# Patient Record
Sex: Male | Born: 1945 | Race: White | Hispanic: No | Marital: Married | Smoking: Former smoker
Health system: Southern US, Community
[De-identification: ages and names within clinical notes are randomized; demographics above are authoritative.]

## PROBLEM LIST (undated history)

## (undated) DIAGNOSIS — Z951 Presence of aortocoronary bypass graft: Secondary | ICD-10-CM

## (undated) DIAGNOSIS — I10 Essential (primary) hypertension: Secondary | ICD-10-CM

## (undated) DIAGNOSIS — I4891 Unspecified atrial fibrillation: Secondary | ICD-10-CM

## (undated) DIAGNOSIS — I2510 Atherosclerotic heart disease of native coronary artery without angina pectoris: Secondary | ICD-10-CM

## (undated) HISTORY — PX: CATARACT EXTRACTION W/ INTRAOCULAR LENS IMPLANT: SHX1309

## (undated) HISTORY — PX: HAND SURGERY: SHX662

## (undated) HISTORY — DX: Essential (primary) hypertension: I10

---
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
## 1997-11-26 HISTORY — PX: HERNIA REPAIR: SHX51

## 2016-01-28 DIAGNOSIS — J309 Allergic rhinitis, unspecified: Secondary | ICD-10-CM | POA: Diagnosis not present

## 2016-01-28 DIAGNOSIS — R05 Cough: Secondary | ICD-10-CM | POA: Diagnosis not present

## 2016-04-27 DIAGNOSIS — J069 Acute upper respiratory infection, unspecified: Secondary | ICD-10-CM | POA: Diagnosis not present

## 2016-05-02 DIAGNOSIS — J309 Allergic rhinitis, unspecified: Secondary | ICD-10-CM | POA: Diagnosis not present

## 2016-05-02 DIAGNOSIS — J0100 Acute maxillary sinusitis, unspecified: Secondary | ICD-10-CM | POA: Diagnosis not present

## 2016-05-21 DIAGNOSIS — R05 Cough: Secondary | ICD-10-CM | POA: Diagnosis not present

## 2016-05-21 DIAGNOSIS — R062 Wheezing: Secondary | ICD-10-CM | POA: Diagnosis not present

## 2016-05-21 DIAGNOSIS — R0602 Shortness of breath: Secondary | ICD-10-CM | POA: Diagnosis not present

## 2016-05-21 DIAGNOSIS — J309 Allergic rhinitis, unspecified: Secondary | ICD-10-CM | POA: Diagnosis not present

## 2016-06-04 DIAGNOSIS — I10 Essential (primary) hypertension: Secondary | ICD-10-CM | POA: Diagnosis not present

## 2016-06-04 DIAGNOSIS — J310 Chronic rhinitis: Secondary | ICD-10-CM | POA: Diagnosis not present

## 2016-06-04 DIAGNOSIS — R252 Cramp and spasm: Secondary | ICD-10-CM | POA: Diagnosis not present

## 2016-07-11 DIAGNOSIS — Z1389 Encounter for screening for other disorder: Secondary | ICD-10-CM | POA: Diagnosis not present

## 2016-07-11 DIAGNOSIS — I10 Essential (primary) hypertension: Secondary | ICD-10-CM | POA: Diagnosis not present

## 2016-07-11 DIAGNOSIS — E785 Hyperlipidemia, unspecified: Secondary | ICD-10-CM | POA: Diagnosis not present

## 2016-07-11 DIAGNOSIS — Z1212 Encounter for screening for malignant neoplasm of rectum: Secondary | ICD-10-CM | POA: Diagnosis not present

## 2016-07-11 DIAGNOSIS — Z6839 Body mass index (BMI) 39.0-39.9, adult: Secondary | ICD-10-CM | POA: Diagnosis not present

## 2016-07-11 DIAGNOSIS — R05 Cough: Secondary | ICD-10-CM | POA: Diagnosis not present

## 2016-07-11 DIAGNOSIS — R252 Cramp and spasm: Secondary | ICD-10-CM | POA: Diagnosis not present

## 2016-07-11 DIAGNOSIS — R0602 Shortness of breath: Secondary | ICD-10-CM | POA: Diagnosis not present

## 2016-07-11 DIAGNOSIS — Z0000 Encounter for general adult medical examination without abnormal findings: Secondary | ICD-10-CM | POA: Diagnosis not present

## 2016-07-17 DIAGNOSIS — T7840XA Allergy, unspecified, initial encounter: Secondary | ICD-10-CM | POA: Diagnosis not present

## 2016-07-20 DIAGNOSIS — R351 Nocturia: Secondary | ICD-10-CM | POA: Diagnosis not present

## 2016-07-20 DIAGNOSIS — I10 Essential (primary) hypertension: Secondary | ICD-10-CM | POA: Diagnosis not present

## 2016-07-24 DIAGNOSIS — Z1212 Encounter for screening for malignant neoplasm of rectum: Secondary | ICD-10-CM | POA: Diagnosis not present

## 2016-09-05 DIAGNOSIS — J329 Chronic sinusitis, unspecified: Secondary | ICD-10-CM | POA: Diagnosis not present

## 2016-09-05 DIAGNOSIS — J209 Acute bronchitis, unspecified: Secondary | ICD-10-CM | POA: Diagnosis not present

## 2016-09-10 DIAGNOSIS — J324 Chronic pansinusitis: Secondary | ICD-10-CM | POA: Diagnosis not present

## 2016-09-10 DIAGNOSIS — R05 Cough: Secondary | ICD-10-CM | POA: Diagnosis not present

## 2016-09-24 DIAGNOSIS — I10 Essential (primary) hypertension: Secondary | ICD-10-CM | POA: Diagnosis not present

## 2016-09-24 DIAGNOSIS — I517 Cardiomegaly: Secondary | ICD-10-CM | POA: Diagnosis not present

## 2016-09-24 DIAGNOSIS — R490 Dysphonia: Secondary | ICD-10-CM | POA: Diagnosis not present

## 2016-10-04 DIAGNOSIS — R490 Dysphonia: Secondary | ICD-10-CM | POA: Diagnosis not present

## 2016-10-29 DIAGNOSIS — Z9109 Other allergy status, other than to drugs and biological substances: Secondary | ICD-10-CM | POA: Diagnosis not present

## 2016-10-29 DIAGNOSIS — K219 Gastro-esophageal reflux disease without esophagitis: Secondary | ICD-10-CM | POA: Diagnosis not present

## 2017-09-17 DIAGNOSIS — J309 Allergic rhinitis, unspecified: Secondary | ICD-10-CM | POA: Diagnosis not present

## 2017-09-20 DIAGNOSIS — H25043 Posterior subcapsular polar age-related cataract, bilateral: Secondary | ICD-10-CM | POA: Diagnosis not present

## 2017-09-20 DIAGNOSIS — H25813 Combined forms of age-related cataract, bilateral: Secondary | ICD-10-CM | POA: Diagnosis not present

## 2017-10-07 DIAGNOSIS — H25811 Combined forms of age-related cataract, right eye: Secondary | ICD-10-CM | POA: Diagnosis not present

## 2017-10-07 DIAGNOSIS — H2511 Age-related nuclear cataract, right eye: Secondary | ICD-10-CM | POA: Diagnosis not present

## 2017-11-25 DIAGNOSIS — I10 Essential (primary) hypertension: Secondary | ICD-10-CM | POA: Diagnosis not present

## 2017-11-25 DIAGNOSIS — J329 Chronic sinusitis, unspecified: Secondary | ICD-10-CM | POA: Diagnosis not present

## 2017-11-30 DIAGNOSIS — J309 Allergic rhinitis, unspecified: Secondary | ICD-10-CM | POA: Diagnosis not present

## 2017-12-26 DIAGNOSIS — J320 Chronic maxillary sinusitis: Secondary | ICD-10-CM | POA: Diagnosis not present

## 2017-12-26 DIAGNOSIS — Z9181 History of falling: Secondary | ICD-10-CM | POA: Diagnosis not present

## 2017-12-26 DIAGNOSIS — Z6841 Body Mass Index (BMI) 40.0 and over, adult: Secondary | ICD-10-CM | POA: Diagnosis not present

## 2017-12-26 DIAGNOSIS — Z1331 Encounter for screening for depression: Secondary | ICD-10-CM | POA: Diagnosis not present

## 2018-01-31 DIAGNOSIS — J320 Chronic maxillary sinusitis: Secondary | ICD-10-CM | POA: Diagnosis not present

## 2018-01-31 DIAGNOSIS — R05 Cough: Secondary | ICD-10-CM | POA: Diagnosis not present

## 2018-01-31 DIAGNOSIS — E538 Deficiency of other specified B group vitamins: Secondary | ICD-10-CM | POA: Diagnosis not present

## 2018-05-23 DIAGNOSIS — H7291 Unspecified perforation of tympanic membrane, right ear: Secondary | ICD-10-CM | POA: Diagnosis not present

## 2018-05-23 DIAGNOSIS — H6691 Otitis media, unspecified, right ear: Secondary | ICD-10-CM | POA: Diagnosis not present

## 2018-05-23 DIAGNOSIS — Z6841 Body Mass Index (BMI) 40.0 and over, adult: Secondary | ICD-10-CM | POA: Diagnosis not present

## 2018-06-06 DIAGNOSIS — R0982 Postnasal drip: Secondary | ICD-10-CM | POA: Diagnosis not present

## 2018-06-06 DIAGNOSIS — Z6841 Body Mass Index (BMI) 40.0 and over, adult: Secondary | ICD-10-CM | POA: Diagnosis not present

## 2018-06-06 DIAGNOSIS — H7291 Unspecified perforation of tympanic membrane, right ear: Secondary | ICD-10-CM | POA: Diagnosis not present

## 2018-06-06 DIAGNOSIS — H6691 Otitis media, unspecified, right ear: Secondary | ICD-10-CM | POA: Diagnosis not present

## 2018-07-18 DIAGNOSIS — Z6841 Body Mass Index (BMI) 40.0 and over, adult: Secondary | ICD-10-CM | POA: Diagnosis not present

## 2018-07-18 DIAGNOSIS — R0982 Postnasal drip: Secondary | ICD-10-CM | POA: Diagnosis not present

## 2018-07-18 DIAGNOSIS — Z0000 Encounter for general adult medical examination without abnormal findings: Secondary | ICD-10-CM | POA: Diagnosis not present

## 2018-07-18 DIAGNOSIS — R05 Cough: Secondary | ICD-10-CM | POA: Diagnosis not present

## 2018-07-18 DIAGNOSIS — Z1211 Encounter for screening for malignant neoplasm of colon: Secondary | ICD-10-CM | POA: Diagnosis not present

## 2018-09-01 ENCOUNTER — Ambulatory Visit (INDEPENDENT_AMBULATORY_CARE_PROVIDER_SITE_OTHER): Payer: PPO | Admitting: Allergy and Immunology

## 2018-09-01 ENCOUNTER — Encounter: Payer: Self-pay | Admitting: Allergy and Immunology

## 2018-09-01 VITALS — BP 140/92 | HR 64 | Resp 20 | Ht 67.0 in | Wt 270.0 lb

## 2018-09-01 DIAGNOSIS — K219 Gastro-esophageal reflux disease without esophagitis: Secondary | ICD-10-CM | POA: Diagnosis not present

## 2018-09-01 DIAGNOSIS — J3089 Other allergic rhinitis: Secondary | ICD-10-CM | POA: Diagnosis not present

## 2018-09-01 MED ORDER — FAMOTIDINE 40 MG PO TABS: 40.0000 mg | ORAL_TABLET | Freq: Every evening | ORAL | 5 refills | Status: DC

## 2018-09-01 MED ORDER — MONTELUKAST SODIUM 10 MG PO TABS: 10.0000 mg | ORAL_TABLET | Freq: Every day | ORAL | 5 refills | Status: DC

## 2018-09-01 MED ORDER — ESOMEPRAZOLE MAGNESIUM 40 MG PO CPDR: 40.0000 mg | DELAYED_RELEASE_CAPSULE | Freq: Two times a day (BID) | ORAL | 5 refills | Status: DC

## 2018-09-01 NOTE — Patient Instructions (Addendum)
  1.  Treat and prevent inflammation:   A.  OTC Nasacort 1 spray each nostril 1 time per day  B.  Montelukast 10 mg tablet 1 time per day  2.  Treat and prevent reflux:   A.   Increase Nexium 40 mg two times per day  B.  Start famotidine 40 mg and evening  C.  Consolidate caffeine consumption  3.  If needed:   A.  Cetirizine 10 mg tablet 1 time per day  B. Azelastine -1-2 sprays each nostril twice a day  4.  Return to clinic in 3 weeks or earlier if problem

## 2018-09-01 NOTE — Progress Notes (Signed)
Dear Dr. Nicki Reaper,  Thank you for referring Tony Holloway to the [facility] on 09/01/2018.   Below is a summation of this patient's evaluation and recommendations.  Thank you for your referral. I will keep you informed about this patient's response to treatment.   If you have any questions please do not hesitate to contact me.   Sincerely,  Jiles Prows, MD Allergy / Immunology [place]   ______________________________________________________________________    NEW PATIENT NOTE  Referring Provider: Myer Peer, MD Primary Provider: Myer Peer, MD Date of office visit: 09/01/2018    Subjective:   Chief Complaint:  Tony Holloway (DOB: [birth year]) is a 72 y.o. male who presents to the clinic on 09/01/2018 with a chief complaint of Cough .     HPI: Erin presents to this clinic in evaluation of cough.  He has a 5-year history of developing unrelenting cough occasionally associated with posttussive syncope.  He states that he develops an issue where his nose start pouring with lots of blowing and then it drips down into his throat and sets off his coughing spell.  As well, he has a daily sensation of having postnasal drip in his throat and a knot in his throat and constant throat clearing.  This has been somewhat worse over the course of the past 2 months or so.  There is not a history of posttussive emesis but he does have a lot of gagging.  There is not a history of chest tightness or shortness of breath or wheezing.  There is not a history of sneezing or inability to smell or taste.  Apparently he has had a chest x-ray in 2017 which has been normal and he has had ENT perform rhinoscopy in 2018 which has been normal.  He does have reflux disease for which he is using Nexium 1 time per day.  While utilizing this form of treatment he still has occasional regurgitation high in his  chest and throat at nighttime.  He consumes 2 cups of coffee per day and just recently discontinued his 3 diet Cokes per day.  He does not consume tea or chocolate or alcohol.    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  Past Medical History:  Diagnosis Date  . Hypertension     Past Surgical History:  Procedure Laterality Date  . CATARACT EXTRACTION W/ INTRAOCULAR LENS IMPLANT    . HAND SURGERY    . HERNIA REPAIR  1999    Allergies as of 09/01/2018      Reactions   Codeine Other (See Comments)   Shaking, dizzy, lightheaded      Medication List      Azelastine HCl 0.15 % Soln Place 2 sprays into both nostrils 2 (two) times daily.   cefdinir 300 MG capsule Commonly known as:  OMNICEF Take 1 capsule by mouth daily.   esomeprazole 40 MG capsule Commonly known as:  NEXIUM Take 1 capsule by mouth daily.   glucosamine-chondroitin 500-400 MG tablet Take 1 tablet by mouth 3 (three) times daily.   metoprolol tartrate 25 MG tablet Commonly known as:  LOPRESSOR Take 1 tablet by mouth daily.       Review of systems negative except as noted in HPI / PMHx or noted below:  Review of Systems  Constitutional: Negative.   HENT: Negative.  Eyes: Negative.   Respiratory: Negative.   Cardiovascular: Negative.   Gastrointestinal: Negative.   Genitourinary: Negative.   Musculoskeletal: Negative.   Skin: Negative.   Neurological: Negative.   Endo/Heme/Allergies: Negative.   Psychiatric/Behavioral: Negative.     Family History  Problem Relation Age of Onset  . Lung cancer Father     Social History   Socioeconomic History  . Marital status: Married    Spouse name: Not on file  . Number of children: Not on file  . Years of education: Not on file  . Highest education level: Not on file  Occupational History  . Not on file  Social Needs  . Financial resource strain: Not on file  . Food insecurity:    Worry: Not on file     Inability: Not on file  . Transportation needs:    Medical: Not on file    Non-medical: Not on file  Tobacco Use  . Smoking status: Former Smoker    Types: Cigarettes    Last attempt to quit: 09/01/1978    Years since quitting: 40.0  . Smokeless tobacco: Never Used  Substance and Sexual Activity  . Alcohol use: Never    Frequency: Never  . Drug use: Never  . Sexual activity: Not on file  Lifestyle  . Physical activity:    Days per week: Not on file    Minutes per session: Not on file  . Stress: Not on file  Relationships  . Social connections:    Talks on phone: Not on file    Gets together: Not on file    Attends religious service: Not on file    Active member of club or organization: Not on file    Attends meetings of clubs or organizations: Not on file    Relationship status: Not on file  . Intimate partner violence:    Fear of current or ex partner: Not on file    Emotionally abused: Not on file    Physically abused: Not on file    Forced sexual activity: Not on file  Other Topics Concern  . Not on file  Social History Narrative  . Not on file    Environmental and Social history  Lives in a house with a dry environment, 2 dogs located in the house, carpet in the bedroom, no plastic on the bed or pillow, no smokers located in the house.   Objective:   Vitals:   09/01/18 1411  BP: (!) 140/92  Pulse: 64  Resp: 20  SpO2: 95%   Height: 5\' 7"  (170.2 cm) Weight: 270 lb (122.5 kg)  Physical Exam  HENT:  Head: Normocephalic. Head is without right periorbital erythema and without left periorbital erythema.  Right Ear: Tympanic membrane, external ear and ear canal normal.  Left Ear: Tympanic membrane, external ear and ear canal normal.  Nose: Nose normal. No mucosal edema or rhinorrhea.  Mouth/Throat: Oropharynx is clear and moist and mucous membranes are normal. No oropharyngeal exudate.  Eyes: Pupils are equal, round, and reactive to light. Conjunctivae and  lids are normal.  Neck: Trachea normal. No tracheal deviation present. No thyromegaly present.  Cardiovascular: Normal rate, regular rhythm, S1 normal, S2 normal and normal heart sounds.  No murmur heard. Pulmonary/Chest: Effort normal. No stridor. No respiratory distress. He has no wheezes. He has no rales. He exhibits no tenderness.  Abdominal: Soft. He exhibits no distension and no mass. There is no hepatosplenomegaly. There is no tenderness. There is no rebound  and no guarding.  Musculoskeletal: He exhibits no edema or tenderness.  Lymphadenopathy:       Head (right side): No tonsillar adenopathy present.       Head (left side): No tonsillar adenopathy present.    He has no cervical adenopathy.    He has no axillary adenopathy.  Neurological: He is alert.  Skin: No rash noted. He is not diaphoretic. No erythema. No pallor. Nails show no clubbing.    Diagnostics: Allergy skin tests were not performed secondary to the recent use of an antihistamine.   Spirometry was performed and demonstrated an FEV1 of 2.59 @ 90 % of predicted. FEV1/FVC = 0.84  Review of the chest x-ray obtained 11 July 2016 identified bilateral cardiac enlargement without any significant lung abnormality.  Assessment and Plan:    1. Other allergic rhinitis   2. LPRD (laryngopharyngeal reflux disease)     1.  Treat and prevent inflammation:   A.  OTC Nasacort 1 spray each nostril 1 time per day  B.  Montelukast 10 mg tablet 1 time per day  2.  Treat and prevent reflux:   A.   Increase Nexium 40 mg two times per day  B.  Start famotidine 40 mg and evening  C.  Consolidate caffeine consumption  3.  If needed:   A.  Cetirizine 10 mg tablet 1 time per day  B. Azelastine -1-2 sprays each nostril twice a day  4.  Return to clinic in 3 weeks or earlier if problem  Jahbari appears to have significant inflammation and irritation of his airway and we will treat him with anti-inflammatory agents for his upper  airway and treat him more aggressively for his reflux to address the irritation of his mid airway.  I will regroup with him in 3 weeks to assess his response to this approach.  Further evaluation and treatment will be based upon his response.  Jiles Prows, MD Allergy / Immunology [facility]

## 2018-09-02 ENCOUNTER — Encounter: Payer: Self-pay | Admitting: Allergy and Immunology

## 2018-09-02 ENCOUNTER — Telehealth: Payer: Self-pay | Admitting: Allergy and Immunology

## 2018-09-02 NOTE — Telephone Encounter (Signed)
Tony Holloway's wife called and states that Tony Holloway takes Zyrtec at bedtime and Dr. Neldon Mc wants him to take Singulair at bedtime as well.  She would like to know if it is safe to take those two together at bed time?  Please advise.

## 2018-09-02 NOTE — Telephone Encounter (Signed)
Patient wife advised of okay to take medications together

## 2018-09-03 ENCOUNTER — Encounter: Payer: Self-pay | Admitting: Allergy and Immunology

## 2018-09-16 ENCOUNTER — Ambulatory Visit: Payer: Self-pay

## 2018-09-16 ENCOUNTER — Encounter: Payer: Self-pay | Admitting: Podiatry

## 2018-09-16 ENCOUNTER — Ambulatory Visit: Payer: PPO | Admitting: Podiatry

## 2018-09-16 VITALS — BP 156/83 | HR 66 | Resp 16 | Ht 71.0 in | Wt 263.0 lb

## 2018-09-16 DIAGNOSIS — M2011 Hallux valgus (acquired), right foot: Secondary | ICD-10-CM | POA: Diagnosis not present

## 2018-09-16 DIAGNOSIS — Z23 Encounter for immunization: Secondary | ICD-10-CM | POA: Diagnosis not present

## 2018-09-16 DIAGNOSIS — M21611 Bunion of right foot: Secondary | ICD-10-CM | POA: Diagnosis not present

## 2018-09-16 DIAGNOSIS — M21961 Unspecified acquired deformity of right lower leg: Secondary | ICD-10-CM | POA: Diagnosis not present

## 2018-09-16 DIAGNOSIS — G5781 Other specified mononeuropathies of right lower limb: Secondary | ICD-10-CM

## 2018-09-16 DIAGNOSIS — M205X1 Other deformities of toe(s) (acquired), right foot: Secondary | ICD-10-CM

## 2018-09-16 DIAGNOSIS — M25571 Pain in right ankle and joints of right foot: Secondary | ICD-10-CM | POA: Diagnosis not present

## 2018-09-16 NOTE — Progress Notes (Signed)
   Subjective:    Patient ID: Tony Holloway, male    DOB: January 11, 1946, 72 y.o.   MRN: [id number]  HPI    Review of Systems  Musculoskeletal: Positive for myalgias.  All other systems reviewed and are negative.      Objective:   Physical Exam        Assessment & Plan:

## 2018-09-16 NOTE — Progress Notes (Signed)
  Subjective:  Patient ID: Tony Holloway, male    DOB: Apr 16, 1946,  MRN: [id number]  Chief Complaint  Patient presents with  . Pain    R sub met 2 x 2 mo; 2/10 dull ache -no injuries Tx: none Pt. stated," it only hurts with certain shoes on and it feels like stepping on a marshmellow."    72 y.o. male presents with the above complaint. Above history confirmed with patient. Also complains or sometimes painful right foot bunion, had for many years, with some deep pain within the joint.  Review of Systems: Negative except as noted in the HPI. Denies N/V/F/Ch.  Past Medical History:  Diagnosis Date  . Hypertension     Current Outpatient Medications:  .  Azelastine HCl 0.15 % SOLN, Place 2 sprays into both nostrils 2 (two) times daily., Disp: , Rfl:  .  esomeprazole (NEXIUM) 40 MG capsule, Take 1 capsule (40 mg total) by mouth 2 (two) times daily., Disp: 60 capsule, Rfl: 5 .  famotidine (PEPCID) 40 MG tablet, Take 1 tablet (40 mg total) by mouth every evening., Disp: 30 tablet, Rfl: 5 .  glucosamine-chondroitin 500-400 MG tablet, Take 1 tablet by mouth 3 (three) times daily., Disp: , Rfl:  .  metoprolol tartrate (LOPRESSOR) 25 MG tablet, Take 1 tablet by mouth daily., Disp: , Rfl:  .  montelukast (SINGULAIR) 10 MG tablet, Take 1 tablet (10 mg total) by mouth at bedtime., Disp: 30 tablet, Rfl: 5  Social History   Tobacco Use  Smoking Status Former Smoker  . Types: Cigarettes  . Last attempt to quit: 09/01/1978  . Years since quitting: 40.0  Smokeless Tobacco Never Used    Allergies  Allergen Reactions  . Codeine Other (See Comments)    Shaking, dizzy, lightheaded   Objective:   Vitals:   09/16/18 1414  BP: (!) 156/83  Pulse: 66  Resp: 16   Body mass index is 36.68 kg/m. Constitutional Well developed. Well nourished.  Vascular Dorsalis pedis pulses palpable bilaterally. Posterior tibial pulses palpable bilaterally. Capillary refill normal to all digits.  No cyanosis or  clubbing noted. Pedal hair growth normal.  Neurologic Normal speech. Oriented to person, place, and time. Epicritic sensation to light touch grossly present bilaterally.  Dermatologic Nails well groomed and normal in appearance. No open wounds. No skin lesions.  Orthopedic: Decreased R 1st MPJ ROM with crepitus. No visible deformities. POP R 2nd interspace dorsally and plantarly.   Radiographs: Taken and Reviewed. HAV deformity, decreased 2nd/3rd IM Assessment:   1. Hallux abductovalgus with bunions, right   2. Hallux limitus of right foot   3. Interdigital neuroma of right foot   4. Pain in joint of right foot   5. Metatarsal deformity, right    Plan:  Patient was evaluated and treated and all questions answered.  Interdigital Neuroma, right -Educated on etiology -Interspace injection delivered as below. -Educated on padding and proper shoegear  Procedure: Neuroma Injection Location: Right 2nd interspace Skin Prep: Alcohol. Injectate: 0.5 cc 0.5% marcaine plain, 0.5 cc dexamethasone phosphate. Disposition: Patient tolerated procedure well. Injection site dressed with a band-aid.  HAV/Hallux Limitus R -Discussed possible injection should pain worsen. Minimal pain today.  Return in about 3 weeks (around 10/07/2018) for Neuroma, Right.

## 2018-10-07 ENCOUNTER — Ambulatory Visit (INDEPENDENT_AMBULATORY_CARE_PROVIDER_SITE_OTHER): Payer: PPO | Admitting: Podiatry

## 2018-10-07 DIAGNOSIS — M21611 Bunion of right foot: Secondary | ICD-10-CM

## 2018-10-07 DIAGNOSIS — G5781 Other specified mononeuropathies of right lower limb: Secondary | ICD-10-CM | POA: Diagnosis not present

## 2018-10-07 DIAGNOSIS — M205X1 Other deformities of toe(s) (acquired), right foot: Secondary | ICD-10-CM

## 2018-10-07 DIAGNOSIS — M2011 Hallux valgus (acquired), right foot: Secondary | ICD-10-CM

## 2018-10-07 NOTE — Progress Notes (Signed)
  Subjective:  Patient ID: Tony Holloway, male    DOB: Jul 12, 1946,  MRN: [id number]  Chief Complaint  Patient presents with  . Neuroma    F/U R neuroma Pt.s tates," it's doing great, no pain at all.' tx: padding    72 y.o. male presents with the above complaint. Above history confirmed with patient. States the injection and pads help and if he doesn't wear the pads he has pain.  Review of Systems: Negative except as noted in the HPI. Denies N/V/F/Ch.  Past Medical History:  Diagnosis Date  . Hypertension     Current Outpatient Medications:  .  Azelastine HCl 0.15 % SOLN, Place 2 sprays into both nostrils 2 (two) times daily., Disp: , Rfl:  .  esomeprazole (NEXIUM) 40 MG capsule, Take 1 capsule (40 mg total) by mouth 2 (two) times daily., Disp: 60 capsule, Rfl: 5 .  famotidine (PEPCID) 40 MG tablet, Take 1 tablet (40 mg total) by mouth every evening., Disp: 30 tablet, Rfl: 5 .  glucosamine-chondroitin 500-400 MG tablet, Take 1 tablet by mouth 3 (three) times daily., Disp: , Rfl:  .  metoprolol tartrate (LOPRESSOR) 25 MG tablet, Take 1 tablet by mouth daily., Disp: , Rfl:  .  montelukast (SINGULAIR) 10 MG tablet, Take 1 tablet (10 mg total) by mouth at bedtime., Disp: 30 tablet, Rfl: 5  Social History   Tobacco Use  Smoking Status Former Smoker  . Types: Cigarettes  . Last attempt to quit: 09/01/1978  . Years since quitting: 40.1  Smokeless Tobacco Never Used    Allergies  Allergen Reactions  . Codeine Other (See Comments)    Shaking, dizzy, lightheaded   Objective:   There were no vitals filed for this visit. There is no height or weight on file to calculate BMI. Constitutional Well developed. Well nourished.  Vascular Dorsalis pedis pulses palpable bilaterally. Posterior tibial pulses palpable bilaterally. Capillary refill normal to all digits.  No cyanosis or clubbing noted. Pedal hair growth normal.  Neurologic Normal speech. Oriented to person, place, and  time. Epicritic sensation to light touch grossly present bilaterally.  Dermatologic Nails well groomed and normal in appearance. No open wounds. No skin lesions.  Orthopedic: Decreased R 1st MPJ ROM with crepitus. No visible deformities. POP R 2nd interspace dorsally and plantarly.   Radiographs: None today. Assessment:   1. Hallux abductovalgus with bunions, right   2. Hallux limitus of right foot   3. Interdigital neuroma of right foot    Plan:  Patient was evaluated and treated and all questions answered.  Interdigital Neuroma, right / HAV/Hallux Limitus R -No pain at either location. -Hold off injection therapy. -Follow up as needed for pain or injection.  No follow-ups on file.

## 2018-10-17 DIAGNOSIS — R079 Chest pain, unspecified: Secondary | ICD-10-CM | POA: Diagnosis not present

## 2018-10-17 DIAGNOSIS — I249 Acute ischemic heart disease, unspecified: Secondary | ICD-10-CM | POA: Diagnosis not present

## 2018-10-18 DIAGNOSIS — E669 Obesity, unspecified: Secondary | ICD-10-CM | POA: Diagnosis not present

## 2018-10-18 DIAGNOSIS — I160 Hypertensive urgency: Secondary | ICD-10-CM | POA: Diagnosis not present

## 2018-10-18 DIAGNOSIS — I10 Essential (primary) hypertension: Secondary | ICD-10-CM | POA: Diagnosis not present

## 2018-10-18 DIAGNOSIS — K219 Gastro-esophageal reflux disease without esophagitis: Secondary | ICD-10-CM | POA: Diagnosis not present

## 2018-10-18 DIAGNOSIS — R079 Chest pain, unspecified: Secondary | ICD-10-CM | POA: Diagnosis not present

## 2018-10-18 DIAGNOSIS — I209 Angina pectoris, unspecified: Secondary | ICD-10-CM | POA: Diagnosis not present

## 2018-10-18 DIAGNOSIS — R0789 Other chest pain: Secondary | ICD-10-CM | POA: Diagnosis not present

## 2018-10-18 DIAGNOSIS — Z6838 Body mass index (BMI) 38.0-38.9, adult: Secondary | ICD-10-CM | POA: Diagnosis not present

## 2018-10-18 DIAGNOSIS — I249 Acute ischemic heart disease, unspecified: Secondary | ICD-10-CM | POA: Diagnosis not present

## 2018-10-18 DIAGNOSIS — Z79899 Other long term (current) drug therapy: Secondary | ICD-10-CM | POA: Diagnosis not present

## 2018-10-21 ENCOUNTER — Other Ambulatory Visit: Payer: Self-pay

## 2018-10-21 NOTE — Patient Outreach (Signed)
[name]  10/21/2018  Tony Holloway 1946-08-29 [id number]   Referral received. No outreach warranted at this time. Transition of Care  will be completed by primary care provider office who will refer to [facility] care management if needed.  Plan: RN CM will close case.  Jone Baseman, RN, MSN [facility] Care Management Coordinator Direct Line [phone] Cell [phone] Toll Free: [phone]  Fax: [phone]

## 2018-10-22 ENCOUNTER — Encounter: Payer: Self-pay | Admitting: Cardiology

## 2018-10-22 ENCOUNTER — Ambulatory Visit (INDEPENDENT_AMBULATORY_CARE_PROVIDER_SITE_OTHER): Payer: PPO | Admitting: Cardiology

## 2018-10-22 VITALS — BP 132/80 | HR 63 | Ht 71.0 in | Wt 270.0 lb

## 2018-10-22 DIAGNOSIS — R0789 Other chest pain: Secondary | ICD-10-CM | POA: Diagnosis not present

## 2018-10-22 DIAGNOSIS — I10 Essential (primary) hypertension: Secondary | ICD-10-CM | POA: Diagnosis not present

## 2018-10-22 DIAGNOSIS — R079 Chest pain, unspecified: Secondary | ICD-10-CM | POA: Insufficient documentation

## 2018-10-22 DIAGNOSIS — E663 Overweight: Secondary | ICD-10-CM

## 2018-10-22 NOTE — Patient Instructions (Addendum)
Medication Instructions:  Your physician recommends that you continue on your current medications as directed. Please refer to the Current Medication list given to you today.  If you need a refill on your cardiac medications before your next appointment, please call your pharmacy.   Lab work: None  If you have labs (blood work) drawn today and your tests are completely normal, you will receive your results only by: [facility] MyChart Message (if you have MyChart) OR . A paper copy in the mail If you have any lab test that is abnormal or we need to change your treatment, we will call you to review the results.  Testing/Procedures: Your physician has requested that you have en exercise stress myoview. For further information please visit [email or web address]. Please follow instruction sheet, as given.  [facility] [address], [place], [place] [address] [phone]  [facility] testing instructions:  Please present to [facility] 15 minutes earlier than your appointment time to allow for registration.  You will be called with an appointment date and time by our office once it has been scheduled; please allow at least 48 hours for [place] to contact you.  No food or drink after midnight prior to your test (except for small sips of water with your medications).  Hold the following medications the morning of your test: metoprolol  Bring a medication list or all your medications with you the morning of the testing.  No caffeine, decaffeinated or chocolate products 12 hours prior to the testing.  Please be aware that the test can take up to 3-4 hours.  Should you have any problem with the appointment date or time, please call [phone].   Please call the office with any further questions or concerns.   Follow-Up: At [facility], you and your health needs are our priority.  As part of our continuing mission to provide you with exceptional heart care, we have  created designated Provider Care Teams.  These Care Teams include your primary Cardiologist (physician) and Advanced Practice Providers (APPs -  Physician Assistants and Nurse Practitioners) who all work together to provide you with the care you need, when you need it.  You will need a follow up appointment in 6 months.  Please call our office 2 months in advance to schedule this appointment.  You may see another member of our [employer] Provider Team in [place]: Jenne Campus, MD . Shirlee More, MD  Any Other Special Instructions Will Be Listed Below (If Applicable).

## 2018-10-22 NOTE — Progress Notes (Signed)
Cardiology Office Note:    Date:  10/22/2018   ID:  Tony Holloway, DOB 07-30-1946, MRN [id number]  PCP:  Myer Peer, MD  Cardiologist:  Jenean Lindau, MD   Referring MD: Myer Peer, MD    ASSESSMENT:    1. Atypical chest pain   2. Essential hypertension   3. Overweight    PLAN:    In order of problems listed above:  1. Primary prevention stressed with the patient.  Importance of compliance with diet and medication stressed and he vocalized understanding.  His wife was present for the visit. 2. Diet was discussed for obesity and risks of obesity explained and he vocalized understanding. 3. I reviewed [facility] hospital records extensively in view of the fact that he is asymptomatic with activities of routine living I suggested to him exercise stress Cardiolite and is agreeable. 4. Sublingual nitroglycerin prescription was sent, its protocol and 911 protocol explained and the patient vocalized understanding questions were answered to the patient's satisfaction 5. He knows to go to the nearest emergency room for any significant concerns 6. Patient will be seen in follow-up appointment in 6 months or earlier if the patient has any concerns    Medication Adjustments/Labs and Tests Ordered: Current medicines are reviewed at length with the patient today.  Concerns regarding medicines are outlined above.  No orders of the defined types were placed in this encounter.  No orders of the defined types were placed in this encounter.    History of Present Illness:    Tony Holloway is a 72 y.o. male who is being seen today for the evaluation of chest pain.  The patient mentions to me that he went to [facility] hospital and stayed there for 2 days.  He had substernal chest tightness for which she went to the emergency room.  He tells me by the time he go to the emergency room his chest pain was gone.  He had radiation of the symptoms to the left arm.  No orthopnea or PND.  He has  never had these symptoms before.  Since hospital discharge she has ambulated well without any problems.  No chest pain orthopnea or PND now.  At the time of my evaluation, the patient is alert awake oriented and in no distress.  He has history of essential hypertension and is overweight.  No history of dyslipidemia or diabetes mellitus.  Past Medical History:  Diagnosis Date  . Hypertension     Past Surgical History:  Procedure Laterality Date  . CATARACT EXTRACTION W/ INTRAOCULAR LENS IMPLANT    . HAND SURGERY    . HERNIA REPAIR  1999    Current Medications: Current Meds  Medication Sig  . aspirin 81 MG tablet Take 81 mg by mouth daily.   [facility] Kitchen atorvastatin (LIPITOR) 80 MG tablet Take 1 tablet by mouth daily.  [facility] Kitchen glucosamine-chondroitin 500-400 MG tablet Take 1 tablet by mouth 3 (three) times daily.  . metoprolol succinate (TOPROL-XL) 25 MG 24 hr tablet Take 1 tablet by mouth daily.  . montelukast (SINGULAIR) 10 MG tablet Take 1 tablet (10 mg total) by mouth at bedtime.  . nitroGLYCERIN (NITROSTAT) 0.4 MG SL tablet Place 1 tablet under the tongue every 5 (five) minutes as needed.  [facility] Kitchen omeprazole (PRILOSEC) 20 MG capsule Take 20 mg by mouth daily.     Allergies:   Codeine   Social History   Socioeconomic History  . Marital status: Married    Spouse name:  Not on file  . Number of children: Not on file  . Years of education: Not on file  . Highest education level: Not on file  Occupational History  . Not on file  Social Needs  . Financial resource strain: Not on file  . Food insecurity:    Worry: Not on file    Inability: Not on file  . Transportation needs:    Medical: Not on file    Non-medical: Not on file  Tobacco Use  . Smoking status: Former Smoker    Types: Cigarettes    Last attempt to quit: 09/01/1978    Years since quitting: 40.1  . Smokeless tobacco: Never Used  Substance and Sexual Activity  . Alcohol use: Never    Frequency: Never  . Drug use: Never  .  Sexual activity: Not on file  Lifestyle  . Physical activity:    Days per week: Not on file    Minutes per session: Not on file  . Stress: Not on file  Relationships  . Social connections:    Talks on phone: Not on file    Gets together: Not on file    Attends religious service: Not on file    Active member of club or organization: Not on file    Attends meetings of clubs or organizations: Not on file    Relationship status: Not on file  Other Topics Concern  . Not on file  Social History Narrative  . Not on file     Family History: The patient's family history includes Lung cancer in his father.  ROS:   Please see the history of present illness.    All other systems reviewed and are negative.  EKGs/Labs/Other Studies Reviewed:    The following studies were reviewed today: I reviewed  hospital records extensively.   Recent Labs: No results found for requested labs within last 8760 hours.  Recent Lipid Panel No results found for: CHOL, TRIG, HDL, CHOLHDL, VLDL, LDLCALC, LDLDIRECT  Physical Exam:    VS:  BP 132/80 (BP Location: Right Arm, Patient Position: Sitting, Cuff Size: Normal)   Pulse 63   Ht 5\' 11"  (1.803 m)   Wt 270 lb (122.5 kg)   SpO2 98%   BMI 37.66 kg/m     Wt Readings from Last 3 Encounters:  10/22/18 270 lb (122.5 kg)  09/16/18 263 lb (119.3 kg)  09/01/18 270 lb (122.5 kg)     GEN: Patient is in no acute distress HEENT: Normal NECK: No JVD; No carotid bruits LYMPHATICS: No lymphadenopathy CARDIAC: S1 S2 regular, 2/6 systolic murmur at the apex. RESPIRATORY:  Clear to auscultation without rales, wheezing or rhonchi  ABDOMEN: Soft, non-tender, non-distended MUSCULOSKELETAL:  No edema; No deformity  SKIN: Warm and dry NEUROLOGIC:  Alert and oriented x 3 PSYCHIATRIC:  Normal affect    Signed, Jenean Lindau, MD  10/22/2018 3:33 PM    [facility]

## 2018-10-29 DIAGNOSIS — I209 Angina pectoris, unspecified: Secondary | ICD-10-CM | POA: Diagnosis not present

## 2018-10-29 DIAGNOSIS — Z6841 Body Mass Index (BMI) 40.0 and over, adult: Secondary | ICD-10-CM | POA: Diagnosis not present

## 2018-10-30 DIAGNOSIS — R079 Chest pain, unspecified: Secondary | ICD-10-CM | POA: Diagnosis not present

## 2019-01-06 ENCOUNTER — Telehealth: Payer: Self-pay | Admitting: Cardiology

## 2019-01-06 DIAGNOSIS — E663 Overweight: Secondary | ICD-10-CM

## 2019-01-06 DIAGNOSIS — I10 Essential (primary) hypertension: Secondary | ICD-10-CM

## 2019-01-06 DIAGNOSIS — R079 Chest pain, unspecified: Secondary | ICD-10-CM

## 2019-01-06 NOTE — Telephone Encounter (Signed)
Patient report having left arm pain when he is walking his first lap [place] of four at the track. After the first lap it goes away, he has no other symptoms when this happens. He also reports aches all over that remain constant and believes it is from atorvastatin which he is taking 40 mg daily right now. He wants Dr. Geraldo Pitter to advise on this. I will route to him.

## 2019-01-06 NOTE — Telephone Encounter (Signed)
Has been having some "slight issues" of pain in his left elbow and thinks the cholesterol medicine is causing him to hurt all the time

## 2019-01-06 NOTE — Telephone Encounter (Signed)
Left message for patient to return call.

## 2019-01-07 NOTE — Telephone Encounter (Signed)
All to be done now

## 2019-01-07 NOTE — Telephone Encounter (Signed)
If the arm pain goes away after the first lab and he has no issues in the subsequent 3 labs then it is unlikely to be cardiac.  If he continues to have issues that he will need to be evaluated.  He can drop his statin therapy for a week or 2 and see how he feels.  Blood work fasting will need to be done including Chem-7 TSH and liver lipid and CPK.

## 2019-01-07 NOTE — Telephone Encounter (Signed)
Patient informed of Dr. Julien Nordmann recommendation. He will monitor left arm pain and notify us if it continues He will have fasting labs drawn today and stop his lipitor for 2 weeks and let us know how he feels afterwards.

## 2019-01-07 NOTE — Addendum Note (Signed)
Addended by: Ashok Norris on: 01/07/2019 10:15 AM   Modules accepted: Orders

## 2019-01-08 DIAGNOSIS — R079 Chest pain, unspecified: Secondary | ICD-10-CM | POA: Diagnosis not present

## 2019-01-08 DIAGNOSIS — I10 Essential (primary) hypertension: Secondary | ICD-10-CM | POA: Diagnosis not present

## 2019-01-08 DIAGNOSIS — E663 Overweight: Secondary | ICD-10-CM | POA: Diagnosis not present

## 2019-01-09 LAB — BASIC METABOLIC PANEL
BUN / CREAT RATIO: 13 (ref 10–24)
BUN: 14 mg/dL (ref 8–27)
CO2: 22 mmol/L (ref 20–29)
Calcium: 9.4 mg/dL (ref 8.6–10.2)
Chloride: 100 mmol/L (ref 96–106)
Creatinine, Ser: 1.12 mg/dL (ref 0.76–1.27)
GFR calc non Af Amer: 66 mL/min/{1.73_m2} (ref >59)
GFR, EST AFRICAN AMERICAN: 77 mL/min/{1.73_m2} (ref >59)
Glucose: 102 mg/dL — ABNORMAL HIGH (ref 65–99)
Potassium: 4.8 mmol/L (ref 3.5–5.2)
Sodium: 140 mmol/L (ref 134–144)

## 2019-01-09 LAB — LIPID PANEL
Chol/HDL Ratio: 3.7 ratio (ref 0.0–5.0)
Cholesterol, Total: 114 mg/dL (ref 100–199)
HDL: 31 mg/dL — ABNORMAL LOW (ref >39)
LDL Calculated: 59 mg/dL (ref 0–99)
Triglycerides: 119 mg/dL (ref 0–149)
VLDL Cholesterol Cal: 24 mg/dL (ref 5–40)

## 2019-01-09 LAB — HEPATIC FUNCTION PANEL
ALT: 27 IU/L (ref 0–44)
AST: 27 IU/L (ref 0–40)
Albumin: 4.4 g/dL (ref 3.8–4.8)
Alkaline Phosphatase: 120 IU/L — ABNORMAL HIGH (ref 39–117)
Bilirubin Total: 0.7 mg/dL (ref 0.0–1.2)
Bilirubin, Direct: 0.19 mg/dL (ref 0.00–0.40)
TOTAL PROTEIN: 6.4 g/dL (ref 6.0–8.5)

## 2019-01-09 LAB — CK: Total CK: 90 U/L (ref 24–204)

## 2019-01-09 LAB — TSH: TSH: 3.89 u[IU]/mL (ref 0.450–4.500)

## 2019-01-18 DIAGNOSIS — L259 Unspecified contact dermatitis, unspecified cause: Secondary | ICD-10-CM | POA: Diagnosis not present

## 2019-01-18 DIAGNOSIS — B029 Zoster without complications: Secondary | ICD-10-CM | POA: Diagnosis not present

## 2019-02-17 ENCOUNTER — Telehealth: Payer: Self-pay | Admitting: *Deleted

## 2019-02-17 MED ORDER — METOPROLOL SUCCINATE ER 25 MG PO TB24: 25.0000 mg | ORAL_TABLET | Freq: Every day | ORAL | 3 refills | Status: DC

## 2019-02-17 NOTE — Telephone Encounter (Signed)
*  STAT* If patient is at the pharmacy, call can be transferred to refill team.   1. Which medications need to be refilled? (please list name of each medication and dose if known) Metoprolol succinate 25 mg 24 hr tablet qd  2. Which pharmacy/location (including street and city if local pharmacy) is medication to be sent to?[facility] [place]  3. Do they need a 30 day or 90 day supply? [place]

## 2019-02-26 DIAGNOSIS — G894 Chronic pain syndrome: Secondary | ICD-10-CM | POA: Diagnosis not present

## 2019-02-26 DIAGNOSIS — F1120 Opioid dependence, uncomplicated: Secondary | ICD-10-CM | POA: Diagnosis not present

## 2019-02-26 DIAGNOSIS — B0229 Other postherpetic nervous system involvement: Secondary | ICD-10-CM | POA: Diagnosis not present

## 2019-02-26 DIAGNOSIS — Z8619 Personal history of other infectious and parasitic diseases: Secondary | ICD-10-CM | POA: Diagnosis not present

## 2019-02-26 DIAGNOSIS — Z79891 Long term (current) use of opiate analgesic: Secondary | ICD-10-CM | POA: Diagnosis not present

## 2019-02-26 DIAGNOSIS — Z6836 Body mass index (BMI) 36.0-36.9, adult: Secondary | ICD-10-CM | POA: Diagnosis not present

## 2019-02-26 DIAGNOSIS — G8929 Other chronic pain: Secondary | ICD-10-CM | POA: Diagnosis not present

## 2019-03-11 ENCOUNTER — Other Ambulatory Visit: Payer: Self-pay | Admitting: Allergy and Immunology

## 2019-03-11 NOTE — Telephone Encounter (Signed)
Courtesy refill  

## 2019-04-13 ENCOUNTER — Telehealth: Payer: Self-pay | Admitting: Cardiology

## 2019-04-13 NOTE — Telephone Encounter (Signed)
Virtual Visit Pre-Appointment Phone Call  "(Name), I am calling you today to discuss your upcoming appointment. We are currently trying to limit exposure to the virus that causes COVID-19 by seeing patients at home rather than in the office."  1. "What is the BEST phone number to call the day of the visit?" - include this in appointment notes  2. Do you have or have access to (through a family member/friend) a smartphone with video capability that we can use for your visit?" a. If yes - list this number in appt notes as cell (if different from BEST phone #) and list the appointment type as a VIDEO visit in appointment notes b. If no - list the appointment type as a PHONE visit in appointment notes  Confirm consent - "In the setting of the current Covid19 crisis, you are scheduled for a (phone or video) visit with your provider on (date) at (time).  Just as we do with many in-office visits, in order for you to participate in this visit, we must obtain consent.  If you'd like, I can send this to your mychart (if signed up) or email for you to review.  Otherwise, I can obtain your verbal consent now.  All virtual visits are billed to your insurance company just like a normal visit would be.  By agreeing to a virtual visit, we'd like you to understand that the technology does not allow for your provider to perform an examination, and thus may limit your provider's ability to fully assess your condition. If your provider identifies any concerns that need to be evaluated in person, we will make arrangements to do so.  Finally, though the technology is pretty good, we cannot assure that it will always work on either your or our end, and in the setting of a video visit, we may have to convert it to a phone-only visit.  In either situation, we cannot ensure that we have a secure connection.  Are you willing to proceed?" STAFF: Did the patient verbally acknowledge consent to telehealth visit? Document  YES/NO here: YES 3. Advise patient to be prepared - "Two hours prior to your appointment, go ahead and check your blood pressure, pulse, oxygen saturation, and your weight (if you have the equipment to check those) and write them all down. When your visit starts, your provider will ask you for this information. If you have an Apple Watch or Kardia device, please plan to have heart rate information ready on the day of your appointment. Please have a pen and paper handy nearby the day of the visit as well."  4. Give patient instructions for MyChart download to smartphone OR Doximity/Doxy.me as below if video visit (depending on what platform provider is using)  5. Inform patient they will receive a phone call 15 minutes prior to their appointment time (may be from unknown caller ID) so they should be prepared to answer    TELEPHONE CALL NOTE  Tony Holloway has been deemed a candidate for a follow-up tele-health visit to limit community exposure during the Covid-19 pandemic. I spoke with the patient via phone to ensure availability of phone/video source, confirm preferred email & phone number, and discuss instructions and expectations.  I reminded Tony Holloway to be prepared with any vital sign and/or heart rhythm information that could potentially be obtained via home monitoring, at the time of his visit. I reminded Tony Holloway to expect a phone call prior to his visit.  Frederic Jericho 04/13/2019 1:20 PM   INSTRUCTIONS FOR DOWNLOADING THE MYCHART APP TO SMARTPHONE  - The patient must first make sure to have activated MyChart and know their login information - If Apple, go to [employer] and type in MyChart in the search bar and download the app. If Android, ask patient to go to [employer] and type in [place] in the search bar and download the app. The app is free but as with any other app downloads, their phone may require them to verify saved payment information or Apple/Android password.    - The patient will need to then log into the app with their MyChart username and password, and select [facility] as their healthcare provider to link the account. When it is time for your visit, go to the MyChart app, find appointments, and click Begin Video Visit. Be sure to Select Allow for your device to access the Microphone and Camera for your visit. You will then be connected, and your provider will be with you shortly.  **If they have any issues connecting, or need assistance please contact MyChart service desk [phone])**  **If using a computer, in order to ensure the best quality for their visit they will need to use either of the following Internet Browsers: [employer], or Google Chrome**  IF USING DOXIMITY or DOXY.ME - The patient will receive a link just prior to their visit by text.     FULL LENGTH CONSENT FOR TELE-HEALTH VISIT   I hereby voluntarily request, consent and authorize [facility] and its employed or contracted physicians, physician assistants, nurse practitioners or other licensed health care professionals (the Practitioner), to provide me with telemedicine health care services (the Services") as deemed necessary by the treating Practitioner. I acknowledge and consent to receive the Services by the Practitioner via telemedicine. I understand that the telemedicine visit will involve communicating with the Practitioner through live audiovisual communication technology and the disclosure of certain medical information by electronic transmission. I acknowledge that I have been given the opportunity to request an in-person assessment or other available alternative prior to the telemedicine visit and am voluntarily participating in the telemedicine visit.  I understand that I have the right to withhold or withdraw my consent to the use of telemedicine in the course of my care at any time, without affecting my right to future care or treatment, and that  the Practitioner or I may terminate the telemedicine visit at any time. I understand that I have the right to inspect all information obtained and/or recorded in the course of the telemedicine visit and may receive copies of available information for a reasonable fee.  I understand that some of the potential risks of receiving the Services via telemedicine include:   Delay or interruption in medical evaluation due to technological equipment failure or disruption;  Information transmitted may not be sufficient (e.g. poor resolution of images) to allow for appropriate medical decision making by the Practitioner; and/or   In rare instances, security protocols could fail, causing a breach of personal health information.  Furthermore, I acknowledge that it is my responsibility to provide information about my medical history, conditions and care that is complete and accurate to the best of my ability. I acknowledge that Practitioner's advice, recommendations, and/or decision may be based on factors not within their control, such as incomplete or inaccurate data provided by me or distortions of diagnostic images or specimens that may result from electronic transmissions. I understand that  the practice of medicine is not an exact science and that Practitioner makes no warranties or guarantees regarding treatment outcomes. I acknowledge that I will receive a copy of this consent concurrently upon execution via email to the email address I last provided but may also request a printed copy by calling the office of [facility].    I understand that my insurance will be billed for this visit.   I have read or had this consent read to me.  I understand the contents of this consent, which adequately explains the benefits and risks of the Services being provided via telemedicine.   I have been provided ample opportunity to ask questions regarding this consent and the Services and have had my questions answered to  my satisfaction.  I give my informed consent for the services to be provided through the use of telemedicine in my medical care  By participating in this telemedicine visit I agree to the above.

## 2019-04-17 ENCOUNTER — Ambulatory Visit: Payer: PPO | Admitting: Cardiology

## 2019-04-27 ENCOUNTER — Telehealth: Payer: Self-pay | Admitting: Cardiology

## 2019-04-27 NOTE — Telephone Encounter (Signed)
Pt having left shoulder pain when walking

## 2019-04-27 NOTE — Telephone Encounter (Signed)
BP 578'I systolic/60's, he had a sudden sharp pain in his back yesterday. Patient is currently mowing grass with no issues of pain, SOB or swelling. RN reviewed s/s of cardiac distress with his wife. Patient has appt with Dr. Docia Furl on 05/18/19 but RN advised to call office back if any symptoms change or go to emergency room. No further questions at this time.

## 2019-05-09 DIAGNOSIS — A932 Colorado tick fever: Secondary | ICD-10-CM | POA: Diagnosis not present

## 2019-05-18 ENCOUNTER — Encounter: Payer: Self-pay | Admitting: Cardiology

## 2019-05-18 ENCOUNTER — Ambulatory Visit (INDEPENDENT_AMBULATORY_CARE_PROVIDER_SITE_OTHER): Payer: PPO | Admitting: Cardiology

## 2019-05-18 ENCOUNTER — Other Ambulatory Visit: Payer: Self-pay

## 2019-05-18 VITALS — BP 128/72 | HR 71 | Ht 71.0 in | Wt 248.0 lb

## 2019-05-18 DIAGNOSIS — I10 Essential (primary) hypertension: Secondary | ICD-10-CM | POA: Diagnosis not present

## 2019-05-18 DIAGNOSIS — M4604 Spinal enthesopathy, thoracic region: Secondary | ICD-10-CM | POA: Diagnosis not present

## 2019-05-18 DIAGNOSIS — Z01810 Encounter for preprocedural cardiovascular examination: Secondary | ICD-10-CM | POA: Diagnosis not present

## 2019-05-18 DIAGNOSIS — I209 Angina pectoris, unspecified: Secondary | ICD-10-CM | POA: Diagnosis not present

## 2019-05-18 DIAGNOSIS — Z1159 Encounter for screening for other viral diseases: Secondary | ICD-10-CM | POA: Diagnosis not present

## 2019-05-18 DIAGNOSIS — R079 Chest pain, unspecified: Secondary | ICD-10-CM | POA: Diagnosis not present

## 2019-05-18 LAB — BASIC METABOLIC PANEL
BUN/Creatinine Ratio: 13 (ref 10–24)
BUN: 14 mg/dL (ref 8–27)
CO2: 20 mmol/L (ref 20–29)
Calcium: 10.1 mg/dL (ref 8.6–10.2)
Chloride: 99 mmol/L (ref 96–106)
Creatinine, Ser: 1.08 mg/dL (ref 0.76–1.27)
GFR calc Af Amer: 79 mL/min/{1.73_m2} (ref >59)
GFR calc non Af Amer: 69 mL/min/{1.73_m2} (ref >59)
Glucose: 108 mg/dL — ABNORMAL HIGH (ref 65–99)
Potassium: 4.6 mmol/L (ref 3.5–5.2)
Sodium: 139 mmol/L (ref 134–144)

## 2019-05-18 LAB — CBC
Hematocrit: 45 % (ref 37.5–51.0)
Hemoglobin: 15.4 g/dL (ref 13.0–17.7)
MCH: 29.3 pg (ref 26.6–33.0)
MCHC: 34.2 g/dL (ref 31.5–35.7)
MCV: 86 fL (ref 79–97)
Platelets: 277 10*3/uL (ref 150–450)
RBC: 5.25 x10E6/uL (ref 4.14–5.80)
RDW: 13.4 % (ref 11.6–15.4)
WBC: 8.5 10*3/uL (ref 3.4–10.8)

## 2019-05-18 NOTE — Patient Instructions (Signed)
Medication Instructions:  Your physician recommends that you continue on your current medications as directed. Please refer to the Current Medication list given to you today.  If you need a refill on your cardiac medications before your next appointment, please call your pharmacy.   Lab work: You will need to have a BMP and CBC drawn today.  If you have labs (blood work) drawn today and your tests are completely normal, you will receive your results only by:  [facility] (if you have MyChart) OR  A paper copy in the mail If you have any lab test that is abnormal or we need to change your treatment, we will call you to review the results.  Testing/Procedures: YOU HAD AN EKG Performed today.  A chest x-ray takes a picture of the organs and structures inside the chest, including the heart, lungs, and blood vessels. This test can show several things, including, whether the heart is enlarges; whether fluid is building up in the lungs; and whether pacemaker / defibrillator leads are still in place.   Your physician has requested that you have a cardiac catheterization. Cardiac catheterization is used to diagnose and/or treat various heart conditions. Doctors may recommend this procedure for a number of different reasons. The most common reason is to evaluate chest pain. Chest pain can be a symptom of coronary artery disease (CAD), and cardiac catheterization can show whether plaque is narrowing or blocking your hearts arteries. This procedure is also used to evaluate the valves, as well as measure the blood flow and oxygen levels in different parts of your heart. For further information please visit [email or web address][email or web address]. Please follow instruction sheet, as given.     [place] [address] Dept: [phone] Loc: [place]  05/18/2019  You are scheduled for a  Cardiac Catheterization on Friday, June 26 with Dr. Shelva Majestic.  1. Please arrive at the [facility] (Main Entrance A) at [facility]: [address] [place], [place] [address] at 5:30 AM (This time is two hours before your procedure to ensure your preparation). Free valet parking service is available.   Special note: Every effort is made to have your procedure done on time. Please understand that emergencies sometimes delay scheduled procedures.  2. Diet: Do not eat solid foods after midnight.  The patient may have clear liquids until 5am upon the day of the procedure.  3. Labs: You will need to have blood drawn on Monday, June 22 at [facility]: [address], Technical sales engineer . You do not need to be fasting.  4. Medication instructions in preparation for your procedure:   Contrast Allergy: No   On the morning of your procedure, take your Aspirin and any morning medicines NOT listed above.  You may use sips of water.  5. Plan for one night stay--bring personal belongings. 6. Bring a current list of your medications and current insurance cards. 7. You MUST have a responsible person to drive you home. 8. Someone MUST be with you the first 24 hours after you arrive home or your discharge will be delayed. 9. Please wear clothes that are easy to get on and off and wear slip-on shoes.  Thank you for allowing [place] to care for you!   -- [facility]   Follow-Up: At [facility], you and your health needs are our priority.  As part of our continuing  mission to provide you with exceptional heart care, we have created designated Provider Care Teams.  These Care Teams include your primary Cardiologist (physician) and Advanced Practice Providers (APPs -  Physician Assistants and Nurse Practitioners) who all work together to provide you with the care you need, when you need it. You will need a follow up appointment in 1 months.    Any Other Special  Instructions Will Be Listed Below  Coronary Angiogram A coronary angiogram is an X-ray procedure that is used to examine the arteries in the heart. In this procedure, a dye (contrast dye) is injected through a long, thin tube (catheter). The catheter is inserted through the groin, wrist, or arm. The dye is injected into each artery, then X-rays are taken to show if there is a blockage in the arteries of the heart. This procedure can also show if you have valve disease or a disease of the aorta, and it can be used to check the overall function of your heart muscle. You may have a coronary angiogram if:  You are having chest pain, or other symptoms of angina, and you are at risk for heart disease.  You have an abnormal electrocardiogram (ECG) or stress test.  You have chest pain and heart failure.  You are having irregular heart rhythms.  You and your health care provider determine that the benefits of the test information outweigh the risks of the procedure. Let your health care provider know about:  Any allergies you have, including allergies to contrast dye.  All medicines you are taking, including vitamins, herbs, eye drops, creams, and over-the-counter medicines.  Any problems you or family members have had with anesthetic medicines.  Any blood disorders you have.  Any surgeries you have had.  History of kidney problems or kidney failure.  Any medical conditions you have.  Whether you are pregnant or may be pregnant. What are the risks? Generally, this is a safe procedure. However, problems may occur, including:  Infection.  Allergic reaction to medicines or dyes that are used.  Bleeding from the access site or other locations.  Kidney injury, especially in people with impaired kidney function.  Stroke (rare).  Heart attack (rare).  Damage to other structures or organs. What happens before the procedure? Staying hydrated Follow instructions from your health care  provider about hydration, which may include:  Up to 2 hours before the procedure - you may continue to drink clear liquids, such as water, clear fruit juice, black coffee, and plain tea. Eating and drinking restrictions Follow instructions from your health care provider about eating and drinking, which may include:  8 hours before the procedure - stop eating heavy meals or foods such as meat, fried foods, or fatty foods.  6 hours before the procedure - stop eating light meals or foods, such as toast or cereal.  2 hours before the procedure - stop drinking clear liquids. General instructions  Ask your health care provider about: ? Changing or stopping your regular medicines. This is especially important if you are taking diabetes medicines or blood thinners. ? Taking medicines such as ibuprofen. These medicines can thin your blood. Do not take these medicines before your procedure if your health care provider instructs you not to, though aspirin may be recommended prior to coronary angiograms.  Plan to have someone take you home from the hospital or clinic.  You may need to have blood tests or X-rays done. What happens during the procedure?  An IV tube will  be inserted into one of your veins.  You will be given one or more of the following: ? A medicine to help you relax (sedative). ? A medicine to numb the area where the catheter will be inserted into an artery (local anesthetic).  To reduce your risk of infection: ? Your health care team will wash or sanitize their hands. ? Your skin will be washed with soap. ? Hair may be removed from the area where the catheter will be inserted.  You will be connected to a continuous ECG monitor.  The catheter will be inserted into an artery. The location may be in your groin, in your wrist, or in the fold of your arm (near your elbow).  A type of X-ray (fluoroscopy) will be used to help guide the catheter to the opening of the blood vessel  that is being examined.  A dye will be injected into the catheter, and X-rays will be taken. The dye will help to show where any narrowing or blockages are located in the heart arteries.  Tell your health care provider if you have any chest pain or trouble breathing during the procedure.  If blockages are found, your health care provider may perform another procedure, such as inserting a coronary stent. The procedure may vary among health care providers and hospitals. What happens after the procedure?  After the procedure, you will need to keep the area still for a few hours, or for as long as told by your health care provider. If the procedure is done through the groin, you will be instructed to not bend and not cross your legs.  The insertion site will be checked frequently.  The pulse in your foot or wrist will be checked frequently.  You may have additional blood tests, X-rays, and a test that records the electrical activity of your heart (ECG).  Do not drive for 24 hours if you were given a sedative. Summary  A coronary angiogram is an X-ray procedure that is used to look into the arteries in the heart.  During the procedure, a dye (contrast dye) is injected through a long, thin tube (catheter). The catheter is inserted through the groin, wrist, or arm.  Tell your health care provider about any allergies you have, including allergies to contrast dye.  After the procedure, you will need to keep the area still for a few hours, or for as long as told by your health care provider. This information is not intended to replace advice given to you by your health care provider. Make sure you discuss any questions you have with your health care provider. Document Released: 05/19/2003 Document Revised: 08/24/2016 Document Reviewed: 08/24/2016 Elsevier Interactive Patient Education  2019 [employer].

## 2019-05-18 NOTE — Addendum Note (Signed)
Addended by: Beckey Rutter on: 05/18/2019 11:47 AM   Modules accepted: Orders

## 2019-05-18 NOTE — Progress Notes (Signed)
Cardiology Office Note:    Date:  05/18/2019   ID:  Tony Holloway, DOB 09/08/1946, MRN [id number]  PCP:  Street, Sharon Mt, MD  Cardiologist:  Jenean Lindau, MD   Referring MD: [address] Sharon Mt, *    ASSESSMENT:    1. Angina pectoris ([place])   2. Essential hypertension    PLAN:    In order of problems listed above:  1. Chest discomfort: The patient has features suggestive of angina pectoris.  He wants to go out and do physical work and wants to make sure his heart is fine.  His symptoms are very concerning and suggesting of angina.  He has multiple risk factors for coronary artery disease.  I discussed my findings with the patient at extensive length.  I gave him a choice of conventional coronary angiography and CT coronary angiography.I discussed coronary angiography and left heart catheterization with the patient at extensive length. Procedure, benefits and potential risks were explained. Patient had multiple questions which were answered to the patient's satisfaction. Patient agreed and consented for the procedure. Further recommendations will be made based on the findings of the coronary angiography. In the interim. The patient has any significant symptoms he knows to go to the nearest emergency room. 2. Sublingual nitroglycerin prescription was sent, its protocol and 911 protocol explained and the patient vocalized understanding questions were answered to the patient's satisfaction 3. He knows to go to the nearest emergency for any concerning symptoms.  He will be seen in follow-up appointment in 1 month after coronary angiography.  I congratulated him about his diet and significant weight loss.   Medication Adjustments/Labs and Tests Ordered: Current medicines are reviewed at length with the patient today.  Concerns regarding medicines are outlined above.  No orders of the defined types were placed in this encounter.  No orders of the defined types were placed in this  encounter.    No chief complaint on file.    History of Present Illness:    Tony Holloway is a 73 y.o. male with past medical history of essential hypertension.  Patient was evaluated by me for chest pain.  His stress test was unremarkable.  He mentions to me now that he is to walk about 4 miles a day.  He is lost a significant amount of weight with diet.  He mentions to me that now every time he tries to exercise he feels tired and substernal chest tightness and burning radiating to the left arm.  He is very concerned about it.  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  At the time of my evaluation, the patient is alert awake oriented and in no distress.  He also gives history suggesting erectile dysfunction.  Past Medical History:  Diagnosis Date  . Hypertension     Past Surgical History:  Procedure Laterality Date  . CATARACT EXTRACTION W/ INTRAOCULAR LENS IMPLANT    . HAND SURGERY    . HERNIA REPAIR  1999    Current Medications: Current Meds  Medication Sig  . aspirin 81 MG tablet Take 81 mg by mouth daily.   Marland Kitchen doxycycline (VIBRAMYCIN) 100 MG capsule Take 1 capsule by mouth 2 (two) times a day.  . metoprolol succinate (TOPROL-XL) 25 MG 24 hr tablet Take 1 tablet (25 mg total) by mouth daily.  . montelukast (SINGULAIR) 10 MG  tablet TAKE ONE TABLET BY MOUTH AT BEDTIME  . nitroGLYCERIN (NITROSTAT) 0.4 MG SL tablet Place 1 tablet under the tongue every 5 (five) minutes as needed.  Marland Kitchen omeprazole (PRILOSEC) 20 MG capsule Take 20 mg by mouth daily.     Allergies:   Codeine   Social History   Socioeconomic History  . Marital status: Married    Spouse name: Not on file  . Number of children: Not on file  . Years of education: Not on file  . Highest education level: Not on file  Occupational History  . Not on file  Social Needs  . Financial resource strain: Not on file  .  Food insecurity    Worry: Not on file    Inability: Not on file  . Transportation needs    Medical: Not on file    Non-medical: Not on file  Tobacco Use  . Smoking status: Former Smoker    Types: Cigarettes    Quit date: 09/01/1978    Years since quitting: 40.7  . Smokeless tobacco: Never Used  Substance and Sexual Activity  . Alcohol use: Never    Frequency: Never  . Drug use: Never  . Sexual activity: Not on file  Lifestyle  . Physical activity    Days per week: Not on file    Minutes per session: Not on file  . Stress: Not on file  Relationships  . Social Herbalist on phone: Not on file    Gets together: Not on file    Attends religious service: Not on file    Active member of club or organization: Not on file    Attends meetings of clubs or organizations: Not on file    Relationship status: Not on file  Other Topics Concern  . Not on file  Social History Narrative  . Not on file     Family History: The patient's family history includes Lung cancer in his father.  ROS:   Please see the history of present illness.    All other systems reviewed and are negative.  EKGs/Labs/Other Studies Reviewed:    The following studies were reviewed today: I discussed stress test results with the patient at extensive length   Recent Labs: 01/08/2019: ALT 27; BUN 14; Creatinine, Ser 1.12; Potassium 4.8; Sodium 140; TSH 3.890  Recent Lipid Panel    Component Value Date/Time   CHOL 114 01/08/2019 0831   TRIG 119 01/08/2019 0831   HDL 31 (L) 01/08/2019 0831   CHOLHDL 3.7 01/08/2019 0831   LDLCALC 59 01/08/2019 0831    Physical Exam:    VS:  BP 128/72 (BP Location: Left Arm, Patient Position: Sitting, Cuff Size: Normal)   Pulse 71   Ht 5\' 11"  (1.803 m)   Wt 248 lb (112.5 kg)   SpO2 98%   BMI 34.59 kg/m     Wt Readings from Last 3 Encounters:  05/18/19 248 lb (112.5 kg)  10/22/18 270 lb (122.5 kg)  09/16/18 263 lb (119.3 kg)     GEN: Patient is in no  acute distress HEENT: Normal NECK: No JVD; No carotid bruits LYMPHATICS: No lymphadenopathy CARDIAC: Hear sounds regular, 2/6 systolic murmur at the apex. RESPIRATORY:  Clear to auscultation without rales, wheezing or rhonchi  ABDOMEN: Soft, non-tender, non-distended MUSCULOSKELETAL:  No edema; No deformity  SKIN: Warm and dry NEUROLOGIC:  Alert and oriented x 3 PSYCHIATRIC:  Normal affect   Signed, Jenean Lindau, MD  05/18/2019 11:00 AM  [facility]

## 2019-05-19 ENCOUNTER — Telehealth: Payer: Self-pay

## 2019-05-19 NOTE — Telephone Encounter (Signed)
Results relayed to patient with no further concerns. Copy sent to Dr. Venetia Maxon per Dr. Docia Furl request.

## 2019-05-19 NOTE — Telephone Encounter (Signed)
-----   Message from Jenean Lindau, MD sent at 05/18/2019  4:39 PM EDT ----- The results of the study is unremarkable. Please inform patient. I will discuss in detail at next appointment. Cc  primary care/referring physician Jenean Lindau, MD 05/18/2019 4:39 PM

## 2019-05-20 NOTE — Progress Notes (Signed)
Spoke with Tony Holloway this morning about Covid 19 screen. He states it was approved he tested 6/22 at Urgent Care in [place] and should get the results today and will fax results to [name] at [facility].

## 2019-05-21 ENCOUNTER — Encounter: Payer: Self-pay | Admitting: Cardiology

## 2019-05-21 ENCOUNTER — Telehealth: Payer: Self-pay | Admitting: *Deleted

## 2019-05-21 NOTE — Telephone Encounter (Addendum)
Pt contacted pre-catheterization scheduled at [facility] for: Friday May 22, 2019 7:30 AM Verified arrival time and place: [place] Entrance A at: 5:30 AM  Covid-19 test date:05/18/19-see notes below   No solid food after midnight prior to cath, clear liquids until 5 AM day of procedure. Contrast allergy: no   AM meds can be  taken pre-cath with sip of water including: ASA 81 mg  Confirmed patient has responsible person to drive home post procedure and observe 24 hours after arriving home: yes   Due to Covid-19 pandemic no visitors are allowed in the hospital (unless cognitive impairment).  Their designated party will be called when their procedure is over for an update and to arrange pick up.  Patients are required to wear a mask when they enter the hospital.      COVID-19 Pre-Screening Questions:  . In the past 7 to 10 days have you had a cough,  shortness of breath, headache, congestion, fever (100 or greater) body aches, chills, sore throat, or sudden loss of taste or sense of smell? no . Have you been around anyone with known Covid 19? no . Have you been around anyone who is awaiting Covid 19 test results in the past 7 to 10 days? no . Have you been around anyone who has been exposed to Covid 19, or has mentioned symptoms of Covid 19 within the past 7 to 10 days? no   I reviewed procedure instructions/mask/visitor/Covid-19 screening questions with patient's wife (DPR).  Pt states he called PAT at [facility] on  Monday 05/18/19 and was given okay by Anderson Malta to have pre procedure Covid test at [facility] in [place] on Monday. Pt states he was given fax [phone] to fax results.  Pt states he has not received results of Covid done 05/18/19 at [facility], was told yesterday they should be available today. I offered to schedule appointment for pt to come to [facility] test site this morning for Covid. Pt states he prefers to wait to see if he gets results from [facility] this morning, I have asked him to make copy available to Dr Julien Nordmann office and provided fax [phone]. Pt is aware procedure may need to be rescheduled if Covid results 05/18/19 are not available today.  I have given patient my name and contact information to call if he has questions/concerns.

## 2019-05-21 NOTE — Progress Notes (Addendum)
Called [facility], Covid results are back from 05/18/2019- not detected, Paper copy will be placed in paper chart for procedure for tomorrow.

## 2019-05-21 NOTE — Telephone Encounter (Addendum)
05/18/19 Covid-19 results- not detected- done at [facility]. A copy of these results are in Epic under Media tab.

## 2019-05-22 ENCOUNTER — Inpatient Hospital Stay (HOSPITAL_COMMUNITY): Payer: PPO

## 2019-05-22 ENCOUNTER — Encounter (HOSPITAL_COMMUNITY): Payer: Self-pay | Admitting: Cardiovascular Disease

## 2019-05-22 ENCOUNTER — Inpatient Hospital Stay (HOSPITAL_COMMUNITY)
Admission: AD | Admit: 2019-05-22 | Discharge: 2019-06-01 | DRG: 234 | Disposition: A | Discharge status: Home / Self Care | Payer: PPO | Attending: Cardiothoracic Surgery | Admitting: Cardiothoracic Surgery

## 2019-05-22 ENCOUNTER — Other Ambulatory Visit: Payer: Self-pay | Admitting: *Deleted

## 2019-05-22 ENCOUNTER — Encounter (HOSPITAL_COMMUNITY): Payer: PPO

## 2019-05-22 ENCOUNTER — Other Ambulatory Visit: Payer: Self-pay

## 2019-05-22 ENCOUNTER — Inpatient Hospital Stay (HOSPITAL_COMMUNITY)
Admission: AD | Disposition: A | Discharge status: Home / Self Care | Payer: Self-pay | Source: Home / Self Care | Attending: Cardiothoracic Surgery

## 2019-05-22 DIAGNOSIS — J90 Pleural effusion, not elsewhere classified: Secondary | ICD-10-CM | POA: Diagnosis not present

## 2019-05-22 DIAGNOSIS — I2510 Atherosclerotic heart disease of native coronary artery without angina pectoris: Secondary | ICD-10-CM

## 2019-05-22 DIAGNOSIS — M79671 Pain in right foot: Secondary | ICD-10-CM | POA: Diagnosis not present

## 2019-05-22 DIAGNOSIS — Z87891 Personal history of nicotine dependence: Secondary | ICD-10-CM | POA: Diagnosis not present

## 2019-05-22 DIAGNOSIS — I517 Cardiomegaly: Secondary | ICD-10-CM | POA: Diagnosis not present

## 2019-05-22 DIAGNOSIS — I4891 Unspecified atrial fibrillation: Secondary | ICD-10-CM | POA: Diagnosis present

## 2019-05-22 DIAGNOSIS — N179 Acute kidney failure, unspecified: Secondary | ICD-10-CM | POA: Diagnosis not present

## 2019-05-22 DIAGNOSIS — I25119 Atherosclerotic heart disease of native coronary artery with unspecified angina pectoris: Secondary | ICD-10-CM | POA: Diagnosis not present

## 2019-05-22 DIAGNOSIS — Z9849 Cataract extraction status, unspecified eye: Secondary | ICD-10-CM | POA: Diagnosis not present

## 2019-05-22 DIAGNOSIS — Z961 Presence of intraocular lens: Secondary | ICD-10-CM | POA: Diagnosis not present

## 2019-05-22 DIAGNOSIS — Z885 Allergy status to narcotic agent status: Secondary | ICD-10-CM

## 2019-05-22 DIAGNOSIS — Z7982 Long term (current) use of aspirin: Secondary | ICD-10-CM

## 2019-05-22 DIAGNOSIS — Z801 Family history of malignant neoplasm of trachea, bronchus and lung: Secondary | ICD-10-CM | POA: Diagnosis not present

## 2019-05-22 DIAGNOSIS — E669 Obesity, unspecified: Secondary | ICD-10-CM | POA: Diagnosis not present

## 2019-05-22 DIAGNOSIS — I200 Unstable angina: Secondary | ICD-10-CM | POA: Diagnosis not present

## 2019-05-22 DIAGNOSIS — E876 Hypokalemia: Secondary | ICD-10-CM | POA: Diagnosis not present

## 2019-05-22 DIAGNOSIS — R079 Chest pain, unspecified: Secondary | ICD-10-CM | POA: Diagnosis not present

## 2019-05-22 DIAGNOSIS — E8779 Other fluid overload: Secondary | ICD-10-CM | POA: Diagnosis not present

## 2019-05-22 DIAGNOSIS — Z01810 Encounter for preprocedural cardiovascular examination: Secondary | ICD-10-CM | POA: Diagnosis not present

## 2019-05-22 DIAGNOSIS — I252 Old myocardial infarction: Secondary | ICD-10-CM

## 2019-05-22 DIAGNOSIS — I259 Chronic ischemic heart disease, unspecified: Secondary | ICD-10-CM

## 2019-05-22 DIAGNOSIS — I25110 Atherosclerotic heart disease of native coronary artery with unstable angina pectoris: Principal | ICD-10-CM | POA: Diagnosis present

## 2019-05-22 DIAGNOSIS — I10 Essential (primary) hypertension: Secondary | ICD-10-CM | POA: Diagnosis present

## 2019-05-22 DIAGNOSIS — R0789 Other chest pain: Secondary | ICD-10-CM | POA: Diagnosis present

## 2019-05-22 DIAGNOSIS — Z79899 Other long term (current) drug therapy: Secondary | ICD-10-CM

## 2019-05-22 DIAGNOSIS — Z6834 Body mass index (BMI) 34.0-34.9, adult: Secondary | ICD-10-CM | POA: Diagnosis not present

## 2019-05-22 DIAGNOSIS — Z792 Long term (current) use of antibiotics: Secondary | ICD-10-CM | POA: Diagnosis not present

## 2019-05-22 DIAGNOSIS — D62 Acute posthemorrhagic anemia: Secondary | ICD-10-CM | POA: Diagnosis not present

## 2019-05-22 DIAGNOSIS — I9789 Other postprocedural complications and disorders of the circulatory system, not elsewhere classified: Secondary | ICD-10-CM | POA: Diagnosis not present

## 2019-05-22 DIAGNOSIS — Z1159 Encounter for screening for other viral diseases: Secondary | ICD-10-CM | POA: Diagnosis not present

## 2019-05-22 DIAGNOSIS — I480 Paroxysmal atrial fibrillation: Secondary | ICD-10-CM | POA: Diagnosis not present

## 2019-05-22 DIAGNOSIS — I2584 Coronary atherosclerosis due to calcified coronary lesion: Secondary | ICD-10-CM | POA: Diagnosis not present

## 2019-05-22 DIAGNOSIS — I208 Other forms of angina pectoris: Secondary | ICD-10-CM | POA: Diagnosis not present

## 2019-05-22 DIAGNOSIS — J9811 Atelectasis: Secondary | ICD-10-CM | POA: Diagnosis not present

## 2019-05-22 DIAGNOSIS — I081 Rheumatic disorders of both mitral and tricuspid valves: Secondary | ICD-10-CM | POA: Diagnosis not present

## 2019-05-22 DIAGNOSIS — Z951 Presence of aortocoronary bypass graft: Secondary | ICD-10-CM | POA: Diagnosis not present

## 2019-05-22 DIAGNOSIS — R001 Bradycardia, unspecified: Secondary | ICD-10-CM | POA: Diagnosis not present

## 2019-05-22 HISTORY — DX: Atherosclerotic heart disease of native coronary artery without angina pectoris: I25.10

## 2019-05-22 HISTORY — PX: LEFT HEART CATH AND CORONARY ANGIOGRAPHY: CATH118249

## 2019-05-22 LAB — CREATININE, SERUM
Creatinine, Ser: 1.15 mg/dL (ref 0.61–1.24)
GFR calc Af Amer: >60 mL/min (ref >60)
GFR calc non Af Amer: >60 mL/min (ref >60)

## 2019-05-22 LAB — URINALYSIS, ROUTINE W REFLEX MICROSCOPIC
Bacteria, UA: NONE SEEN
Bilirubin Urine: NEGATIVE
Glucose, UA: NEGATIVE mg/dL
Ketones, ur: NEGATIVE mg/dL
Leukocytes,Ua: NEGATIVE
Nitrite: NEGATIVE
Protein, ur: NEGATIVE mg/dL
Specific Gravity, Urine: 1.021 (ref 1.005–1.030)
pH: 5 (ref 5.0–8.0)

## 2019-05-22 LAB — CBC
HCT: 39.6 % (ref 39.0–52.0)
Hemoglobin: 13.3 g/dL (ref 13.0–17.0)
MCH: 28.9 pg (ref 26.0–34.0)
MCHC: 33.6 g/dL (ref 30.0–36.0)
MCV: 86.1 fL (ref 80.0–100.0)
Platelets: 263 10*3/uL (ref 150–400)
RBC: 4.6 MIL/uL (ref 4.22–5.81)
RDW: 13.4 % (ref 11.5–15.5)
WBC: 8.7 10*3/uL (ref 4.0–10.5)
nRBC: 0 % (ref 0.0–0.2)

## 2019-05-22 LAB — ECHOCARDIOGRAM COMPLETE
Height: 71 in
Weight: 3968 oz

## 2019-05-22 SURGERY — LEFT HEART CATH AND CORONARY ANGIOGRAPHY
Anesthesia: LOCAL

## 2019-05-22 MED ORDER — SODIUM CHLORIDE 0.9 % IV SOLN: 250.0000 mL | INTRAVENOUS | Status: DC | PRN

## 2019-05-22 MED ORDER — IOHEXOL 350 MG/ML SOLN
INTRAVENOUS | Status: DC | PRN
  Administered 2019-05-22: 10:00:00 130 mL via INTRA_ARTERIAL

## 2019-05-22 MED ORDER — ASPIRIN 81 MG PO CHEW
81.0000 mg | CHEWABLE_TABLET | Freq: Every day | ORAL | Status: DC
  Administered 2019-05-23 – 2019-05-24 (×2): 81 mg via ORAL
  Filled 2019-05-22 (×3): qty 1

## 2019-05-22 MED ORDER — SODIUM CHLORIDE 0.9 % WEIGHT BASED INFUSION: 1.0000 mL/kg/h | INTRAVENOUS | Status: DC

## 2019-05-22 MED ORDER — FENTANYL CITRATE (PF) 100 MCG/2ML IJ SOLN
INTRAMUSCULAR | Status: DC | PRN
  Administered 2019-05-22: 25 ug via INTRAVENOUS
  Administered 2019-05-22: 50 ug via INTRAVENOUS

## 2019-05-22 MED ORDER — METOPROLOL SUCCINATE ER 25 MG PO TB24
25.0000 mg | ORAL_TABLET | Freq: Every day | ORAL | Status: DC
  Administered 2019-05-23 – 2019-05-24 (×2): 25 mg via ORAL
  Filled 2019-05-22 (×3): qty 1

## 2019-05-22 MED ORDER — LIDOCAINE HCL (PF) 1 % IJ SOLN
INTRAMUSCULAR | Status: DC | PRN
  Administered 2019-05-22: 2 mL
  Administered 2019-05-22: 20 mL
  Administered 2019-05-22: 2 mL

## 2019-05-22 MED ORDER — HYDRALAZINE HCL 20 MG/ML IJ SOLN: 10.0000 mg | INTRAMUSCULAR | Status: AC | PRN

## 2019-05-22 MED ORDER — VERAPAMIL HCL 2.5 MG/ML IV SOLN
INTRAVENOUS | Status: AC
  Filled 2019-05-22: qty 2

## 2019-05-22 MED ORDER — ISOSORBIDE MONONITRATE ER 30 MG PO TB24
30.0000 mg | ORAL_TABLET | Freq: Every day | ORAL | Status: DC
  Administered 2019-05-22 – 2019-05-24 (×3): 30 mg via ORAL
  Filled 2019-05-22 (×3): qty 1

## 2019-05-22 MED ORDER — MIDAZOLAM HCL 2 MG/2ML IJ SOLN
INTRAMUSCULAR | Status: AC
  Filled 2019-05-22: qty 2

## 2019-05-22 MED ORDER — LIDOCAINE HCL (PF) 1 % IJ SOLN
INTRAMUSCULAR | Status: AC
  Filled 2019-05-22: qty 30

## 2019-05-22 MED ORDER — SODIUM CHLORIDE 0.9% FLUSH: 3.0000 mL | INTRAVENOUS | Status: DC | PRN

## 2019-05-22 MED ORDER — DIAZEPAM 5 MG PO TABS: 5.0000 mg | ORAL_TABLET | Freq: Four times a day (QID) | ORAL | Status: DC | PRN

## 2019-05-22 MED ORDER — ACETAMINOPHEN 325 MG PO TABS: 650.0000 mg | ORAL_TABLET | ORAL | Status: DC | PRN

## 2019-05-22 MED ORDER — SODIUM CHLORIDE 0.9% FLUSH
3.0000 mL | Freq: Two times a day (BID) | INTRAVENOUS | Status: DC
  Administered 2019-05-22 – 2019-05-24 (×5): 3 mL via INTRAVENOUS

## 2019-05-22 MED ORDER — SODIUM CHLORIDE 0.9 % IV SOLN
INTRAVENOUS | Status: DC
  Administered 2019-05-22 – 2019-05-25 (×3): via INTRAVENOUS

## 2019-05-22 MED ORDER — HEPARIN (PORCINE) IN NACL 1000-0.9 UT/500ML-% IV SOLN
INTRAVENOUS | Status: DC | PRN
  Administered 2019-05-22 (×2): 500 mL

## 2019-05-22 MED ORDER — ENOXAPARIN SODIUM 40 MG/0.4ML ~~LOC~~ SOLN
40.0000 mg | SUBCUTANEOUS | Status: DC
  Administered 2019-05-23 – 2019-05-24 (×2): 40 mg via SUBCUTANEOUS
  Filled 2019-05-22 (×2): qty 0.4

## 2019-05-22 MED ORDER — ONDANSETRON HCL 4 MG/2ML IJ SOLN: 4.0000 mg | Freq: Four times a day (QID) | INTRAMUSCULAR | Status: DC | PRN

## 2019-05-22 MED ORDER — LABETALOL HCL 5 MG/ML IV SOLN: 10.0000 mg | INTRAVENOUS | Status: AC | PRN

## 2019-05-22 MED ORDER — SODIUM CHLORIDE 0.9% FLUSH
3.0000 mL | Freq: Two times a day (BID) | INTRAVENOUS | Status: DC
  Administered 2019-05-22: 3 mL via INTRAVENOUS

## 2019-05-22 MED ORDER — SODIUM CHLORIDE 0.9 % WEIGHT BASED INFUSION
3.0000 mL/kg/h | INTRAVENOUS | Status: DC
  Administered 2019-05-22: 3 mL/kg/h via INTRAVENOUS

## 2019-05-22 MED ORDER — FENTANYL CITRATE (PF) 100 MCG/2ML IJ SOLN
INTRAMUSCULAR | Status: AC
  Filled 2019-05-22: qty 2

## 2019-05-22 MED ORDER — ASPIRIN 81 MG PO CHEW: 81.0000 mg | CHEWABLE_TABLET | ORAL | Status: DC

## 2019-05-22 MED ORDER — NITROGLYCERIN 1 MG/10 ML FOR IR/CATH LAB
INTRA_ARTERIAL | Status: AC
  Filled 2019-05-22: qty 10

## 2019-05-22 MED ORDER — PERFLUTREN LIPID MICROSPHERE
1.0000 mL | INTRAVENOUS | Status: AC | PRN
  Administered 2019-05-22: 3 mL via INTRAVENOUS
  Filled 2019-05-22: qty 10

## 2019-05-22 MED ORDER — MIDAZOLAM HCL 2 MG/2ML IJ SOLN
INTRAMUSCULAR | Status: DC | PRN
  Administered 2019-05-22: 2 mg via INTRAVENOUS
  Administered 2019-05-22: 1 mg via INTRAVENOUS

## 2019-05-22 MED ORDER — HEPARIN SODIUM (PORCINE) 1000 UNIT/ML IJ SOLN
INTRAMUSCULAR | Status: AC
  Filled 2019-05-22: qty 1

## 2019-05-22 MED ORDER — HEPARIN (PORCINE) IN NACL 1000-0.9 UT/500ML-% IV SOLN
INTRAVENOUS | Status: AC
  Filled 2019-05-22: qty 1000

## 2019-05-22 MED ORDER — NITROGLYCERIN 1 MG/10 ML FOR IR/CATH LAB
INTRA_ARTERIAL | Status: DC | PRN
  Administered 2019-05-22: 200 ug via INTRACORONARY

## 2019-05-22 SURGICAL SUPPLY — 16 items
BAG SNAP BAND KOVER 36X36 (MISCELLANEOUS) ×1 IMPLANT
CATH INFINITI 5 FR 3DRC (CATHETERS) ×1 IMPLANT
CATH INFINITI 5FR JL5 (CATHETERS) ×1 IMPLANT
CATH INFINITI 5FR MULTPACK ANG (CATHETERS) ×1 IMPLANT
COVER DOME SNAP 22 D (MISCELLANEOUS) ×1 IMPLANT
GLIDESHEATH SLEND SS 6F .021 (SHEATH) ×1 IMPLANT
GUIDEWIRE INQWIRE 1.5J.035X260 (WIRE) IMPLANT
INQWIRE 1.5J .035X260CM (WIRE) ×2
KIT HEART LEFT (KITS) ×2 IMPLANT
KIT MICROPUNCTURE NIT STIFF (SHEATH) ×1 IMPLANT
PACK CARDIAC CATHETERIZATION (CUSTOM PROCEDURE TRAY) ×2 IMPLANT
SHEATH PINNACLE 5F 10CM (SHEATH) ×1 IMPLANT
SYR MEDRAD MARK 7 150ML (SYRINGE) ×2 IMPLANT
TRANSDUCER W/STOPCOCK (MISCELLANEOUS) ×2 IMPLANT
TUBING CIL FLEX 10 FLL-RA (TUBING) ×2 IMPLANT
WIRE EMERALD 3MM-J .035X150CM (WIRE) ×2 IMPLANT

## 2019-05-22 NOTE — Progress Notes (Signed)
TCTS consulted for CABG evaluation. °

## 2019-05-22 NOTE — Progress Notes (Signed)
[id number] Chart reviewed. Pt getting ready to get ECHO. Gave pt O[facility] booklet, care guide and in the tube handout. Encouraged him to watch pre op video. Will follow up tomorrow. Graylon Good RN BSN 05/22/2019 2:14 PM

## 2019-05-22 NOTE — Consult Note (Addendum)
Tony Holloway       Tony Holloway,[place] [address]             [id number]        Tony Holloway [place] Medical Record #[id number] Date of Birth: Jul 09, 1946   Referring: Tony Sine, MD  Primary Care: Street, Tony [place], MD Primary Cardiologist: Dr. Maude Holloway  Chief Complaint:  Chest pain with exertion.  History of Present Illness: Mr. is a very pleasant and active 73 year old male.  He is cardiac history dates back to November 2019 when he had an episode of ringing in his left ear.  This prompted a trip to the emergency room where he woas found to be severely hypertensive and was told he'd had a heart attack.  Referral was made to his local cardiologist, Dr. Jyl Holloway in [place], and work-up with a stress test followed in December of 2019.  The exercise stress test was completely unrevealing.  The patient, however, was counseled regarding minimizing cardiac risk factors including exercise and weight loss.  He changed his diet and begin a walking program with his wife and lost 40 pounds over the next several  months.  He was prescribed statin therapy but developed myalgias that he could not tolerate.  The symptoms persisted even with the reduced dose so statin was discontinued.  Several weeks ago, he  began having pain in mid chest radiating to his left shoulder with activity. The pain subsided with rest.  He discussed this with Dr. Geraldo Holloway and a follow-up visit was arranged on 05/18/2019.  Dr. Geraldo Holloway felt his symptoms were very concerning for exertional angina pectoralis and recommended further work-up with left heart catheterization.  This study was carried out here at [facility] earlier today and revealed an 80% ostial left main coronary stenosis along with a 50% stenosis of the mid LAD, 90% stenosis of the OM1 followed by a 95% stenosis in the same vessel.  The right coronary artery had a 95% proximal ulcerated lesion.  Ejection fraction was estimated at  50 to 55%.  We have been asked to evaluate Tony Holloway for consideration of coronary bypass grafting.  Tony Holloway retired from [employer] after working for them for 30 years.  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 He has been treated for hypertension for several years but has otherwise been healthy.  He has had no history of lower extremity vericosities or thrombosis.    He had a COVID-19 test on 05/18/2019 and was reported as "not detected"  Current Activity/ Functional Status: Patient is independent with mobility/ambulation, transfers, ADL's, IADL's.   Zubrod Score: At the time of surgery this patients most appropriate activity status/level should be described as: []     0    Normal activity, no symptoms [x]     1    Restricted in physical strenuous activity but ambulatory, able to do out light work []     2    Ambulatory and capable of self care, unable to do work activities, up and about                 more than 50%  Of the time                            []   3    Only limited self care, in bed greater than 50% of waking hours []     4    Completely disabled, no self care, confined to bed or chair []     5    Moribund  Past Medical History:  Diagnosis Date   Hypertension     Past Surgical History:  Procedure Laterality Date   CATARACT EXTRACTION W/ INTRAOCULAR LENS IMPLANT     HAND SURGERY     HERNIA REPAIR  1999    Social History   Tobacco Use  Smoking Status Former Smoker   Types: Cigarettes   Quit date: 09/01/1978   Years since quitting: 40.7  Smokeless Tobacco Never Used    Social History   Substance and Sexual Activity  Alcohol Use Never   Frequency: Never     Allergies  Allergen Reactions   Codeine Other (See Comments)    Shaking, dizzy, lightheaded    Current Facility-Administered  Medications  Medication Dose Route Frequency Provider Last Rate Last Dose   0.9 %  sodium chloride infusion   Intravenous Continuous Tony Sine, MD       0.9 %  sodium chloride infusion  250 mL Intravenous PRN Tony Sine, MD       acetaminophen (TYLENOL) tablet 650 mg  650 mg Oral Q4H PRN Tony Sine, MD       aspirin chewable tablet 81 mg  81 mg Oral Daily Tony Sine, MD       diazepam (VALIUM) tablet 5 mg  5 mg Oral Q6H PRN Tony Sine, MD       [START ON 05/23/2019] enoxaparin (LOVENOX) injection 40 mg  40 mg Subcutaneous Q24H Tony Sine, MD       hydrALAZINE (APRESOLINE) injection 10 mg  10 mg Intravenous Q20 Min PRN Tony Sine, MD       isosorbide mononitrate (IMDUR) 24 hr tablet 30 mg  30 mg Oral Daily Tony Sine, MD       labetalol (NORMODYNE) injection 10 mg  10 mg Intravenous Q10 min PRN Tony Sine, MD       metoprolol succinate (TOPROL-XL) 24 hr tablet 25 mg  25 mg Oral Daily Tony Sine, MD       ondansetron [facility]) injection 4 mg  4 mg Intravenous Q6H PRN Tony Sine, MD       sodium chloride flush (NS) 0.9 % injection 3 mL  3 mL Intravenous Q12H Tony Sine, MD       sodium chloride flush (NS) 0.9 % injection 3 mL  3 mL Intravenous PRN Tony Sine, MD        Medications Prior to Admission  Medication Sig Dispense Refill Last Dose   aspirin EC 81 MG tablet Take 81 mg by mouth daily.   05/22/2019 at 0500   cetirizine (ZYRTEC) 10 MG tablet Take 10 mg by mouth daily at 12 noon.   05/21/2019 at Unknown time   CORICIDIN HBP 10-325-2 MG TABS Take 1 tablet by mouth 2 (two) times daily as needed (runny nose/allergy symptoms.).   Past Week at Unknown time   doxycycline (VIBRAMYCIN) 100 MG capsule Take 100 mg by mouth 2 (two) times a day.       metoprolol succinate (TOPROL-XL) 25 MG 24 hr tablet Take 1 tablet (25 mg total) by mouth daily. 30 tablet 3 05/22/2019 at 0500   Misc Natural Products (OSTEO BI-FLEX JOINT  SHIELD PO)  Take 1 tablet by mouth daily at 12 noon.   05/21/2019 at Unknown time   montelukast (SINGULAIR) 10 MG tablet TAKE ONE TABLET BY MOUTH AT BEDTIME (Patient taking differently: Take 10 mg by mouth at bedtime. ) 30 tablet 0 05/21/2019 at Unknown time   omeprazole (PRILOSEC) 20 MG capsule Take 20 mg by mouth daily.   Past Week at Unknown time   TRACE MIN CACRCUFEKMGMNPSEZN PO Take 1 tablet by mouth daily at 12 noon. Trace Mineral Tablet (OTC)   05/21/2019 at Unknown time   nitroGLYCERIN (NITROSTAT) 0.4 MG SL tablet Place 0.4 mg under the tongue every 5 (five) minutes x 3 doses as needed for chest pain.    More than a month at Unknown time   tetrahydrozoline (VISINE) 0.05 % ophthalmic solution Place 1 drop into both eyes 3 (three) times daily as needed (dry/irritated eyes.).   More than a month at Unknown time    Family History  Problem Relation Age of Onset   Lung cancer Father      Review of Systems:   ROS    Cardiac Review of Systems: Y or  [    ]= no  Chest Pain [  y  ]  Resting SOB [   ] Exertional SOB  [  ]  Orthopnea [  ]   Pedal Edema [   ]    Palpitations [  ] Syncope  [  ]   Presyncope [   ]  General Review of Systems: [Y] = yes [  ]=no Constitional: recent weight change [ y ]; anorexia [  ]; fatigue [  ]; nausea [  ]; night sweats [  ]; fever [  ]; or chills [  ]                                                               Dental: Last Dentist visit:   Eye : blurred vision [  ]; diplopia [   ]; vision changes [  ];  Amaurosis fugax[  ]; Resp: cough [  ];  wheezing[  ];  hemoptysis[  ]; shortness of breath[  ]; paroxysmal nocturnal dyspnea[  ]; dyspnea on exertion[  ]; or orthopnea[  ];  GI:  gallstones[  ], vomiting[  ];  dysphagia[  ]; melena[  ];  hematochezia [  ]; heartburn[  ];   Hx of  Colonoscopy[  ]; GU: kidney stones [  ]; hematuria[  ];   dysuria [  ];  nocturia[  ];  history of     obstruction [  ]; urinary frequency [  ]             Skin: rash, swelling[   ];, hair loss[  ];  peripheral edema[  ];  or itching[  ]; Musculosketetal: myalgias[  ];  joint swelling[  ];  joint erythema[  ];  joint pain[  ];  back pain[  ];  Heme/Lymph: bruising[  ];  bleeding[  ];  anemia[  ];  Neuro: TIA[  ];  headaches[  ];  stroke[  ];  vertigo[  ];  seizures[  ];   paresthesias[  ];  difficulty walking[  ];  Psych:depression[  ]; anxiety[  ];  Endocrine: diabetes[  ];  thyroid dysfunction[  ];                Physical Exam: BP 123/64 (BP Location: Left Arm)    Pulse 63    Temp 98 F (36.7 C) (Oral)    Resp 20    Ht 5\' 11"  (1.803 m)    Wt 112.5 kg    SpO2 98%    BMI 34.59 kg/m    General appearance: alert, cooperative and no distress Head: Normocephalic, without obvious abnormality, atraumatic Neck: no adenopathy, no carotid bruit, no JVD and supple, symmetrical, trachea midline Resp: clear to auscultation bilaterally Cardio: Regular rate and rhythm, S1-S2.  Heart sounds are distant but I did not hear a murmur. GI: soft, non-tender; bowel sounds normal; no masses,  no organomegaly Extremities: extremities normal, atraumatic, no cyanosis or edema Neurologic: Alert and oriented X 3, normal strength and tone. Normal symmetric reflexes. Normal coordination and gait  Diagnostic Studies & Laboratory data:    LEFT HEART CATH AND CORONARY ANGIOGRAPHY  Conclusion    Prox RCA lesion is 95% stenosed.  Prox LAD lesion is 30% stenosed.  Prox LAD to Mid LAD lesion is 50% stenosed.  Ost LM lesion is 80% stenosed.  Prox Cx to Mid Cx lesion is 40% stenosed.  1st Mrg-1 lesion is 90% stenosed.  1st Mrg-2 lesion is 95% stenosed.  LV end diastolic pressure is low.  There is mild left ventricular systolic dysfunction.  The left ventricular ejection fraction is 50-55% by visual estimate.   Evidence for mild coronary calcification with significant multivessel CAD with 80% ostial tapering of the left main; 30 and 50% proximal to mid LAD stenoses; 40% proximal  circumflex stenosis with 90 and 95% ostial and mid stenosis in the OM1 vessel; and large dominant RCA with 95% ulcerated plaque appearing vessel proximally.  Low normal LV function with EF estimated 50% with small area of focal mid inferior hypocontractility.  LVEDP 8 mmHg  RECOMMENDATION: The patient will be admitted to the hospital.  Surgical consultation for consideration of CABG revascularization.  Will initiate high potency statin therapy, nitrates and beta-blockade with aspirin pending surgery.   Recommendations  Antiplatelet/Anticoag Recommend Aspirin 81mg  daily for moderate CAD.  Indications  Angina pectoris ([place]) [I20.9 (ICD-10-CM)]  Procedural Details  Technical Details Mr Siraj Dermody is a 73 year old gentleman who has developed exertional chest shoulder discomfort since November 2019.  He has lost 40 pounds.  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  Patient arrived to the catheterization laboratory in the fasting state.  He was premedicated with Versed 2 mg and fentanyl 50 mcg.  Initially there was a long attempt at performing the procedure through the radial approach.  Patient's radial artery was very deep.  Despite multiple attempts at cannulation with guided ultrasound to assist with access the approach was aborted.  The catheterization was done through the right femoral artery.  Right femoral artery was punctured anteriorly and selective angiography was done with 5 French Judkins 4 and 5 left and JR4 right catheters.  With engagement into the left main there was circular dampening.  A pigtail catheter was used for left ventriculography.  Hemostasis was obtained by direct manual pressure.  With the demonstration of significant multivessel disease including ostial left main stenosis and 95% ulcerated plaque stenosis in a large dominant RCA surgical consultation will be obtained  and the patient will be admitted to the  hospital CABG revascularization.  Estimated blood loss <50 mL.   During this procedure medications were administered to achieve and maintain moderate conscious sedation while the patient's heart rate, blood pressure, and oxygen saturation were continuously monitored and I was present face-to-face 100% of this time.  Medications (Filter: Administrations occurring from 05/22/19 0800 to 05/22/19 1004) (important)  Continuous medications are totaled by the amount administered until 05/22/19 1004.  Medication Rate/Dose/Volume Action  Date Time   fentaNYL (SUBLIMAZE) injection (mcg) 50 mcg Given 05/22/19 0815   Total dose as of 05/22/19 1004 25 mcg Given 0900   75 mcg        midazolam (VERSED) injection (mg) 2 mg Given 05/22/19 0816   Total dose as of 05/22/19 1004 1 mg Given 0900   3 mg        Heparin (Porcine) in NaCl 1000-0.9 UT/500ML-% SOLN (mL) 500 mL Given 05/22/19 0816   Total dose as of 05/22/19 1004 500 mL Given 0816   1,000 mL        lidocaine (PF) (XYLOCAINE) 1 % injection (mL) 2 mL Given 05/22/19 0825   Total dose as of 05/22/19 1004 2 mL Given 0842   24 mL 20 mL Given 0856   nitroGLYCERIN 1 mg/10 mL (100 mcg/mL) - IR/CATH LAB (mcg) 200 mcg Given 05/22/19 0918   Total dose as of 05/22/19 1004        200 mcg        iohexol (OMNIPAQUE) 350 MG/ML injection (mL) 130 mL Given 05/22/19 0939   Total dose as of 05/22/19 1004        130 mL        Sedation Time  Sedation Time Physician-1: 1 hour 25 minutes 23 seconds  Coronary Findings  Diagnostic Dominance: Right Left Main  Ost LM lesion 80% stenosed  Ost LM lesion is 80% stenosed.  Left Anterior Descending  Prox LAD lesion 30% stenosed  Prox LAD lesion is 30% stenosed.  Prox LAD to Mid LAD lesion 50% stenosed  Prox LAD to Mid LAD lesion is 50% stenosed.  Left Circumflex  Prox Cx to Mid Cx lesion 40% stenosed  Prox Cx to Mid Cx lesion is 40% stenosed.  First Obtuse Marginal Branch    1st Mrg-1 lesion 90% stenosed  1st Mrg-1 lesion is 90% stenosed.  1st Mrg-2 lesion 95% stenosed  1st Mrg-2 lesion is 95% stenosed.  Right Coronary Artery  Prox RCA lesion 95% stenosed  Prox RCA lesion is 95% stenosed.  Intervention  No interventions have been documented. Wall Motion  Resting               Left Heart  Left Ventricle The left ventricular size is normal. There is mild left ventricular systolic dysfunction. LV end diastolic pressure is low. The left ventricular ejection fraction is 50-55% by visual estimate. There is low normal global LV contractility with an EF estimate at 50%.  There appears to be a small focal region of mid inferior hypocontractility.  LVEDP is 8 mmHg.  Coronary Diagrams  Diagnostic Dominance: Right    Recent Radiology Findings:   No results found.   I have independently reviewed the above radiologic studies and discussed with the patient   Recent Lab Findings: Lab Results  Component Value Date   WBC 8.5 05/18/2019   HGB 15.4 05/18/2019   HCT 45.0 05/18/2019   PLT 277 05/18/2019   GLUCOSE 108 (H) 05/18/2019   CHOL 114 01/08/2019   TRIG 119 01/08/2019  HDL 31 (L) 01/08/2019   LDLCALC 59 01/08/2019   ALT 27 01/08/2019   AST 27 01/08/2019   NA 139 05/18/2019   K 4.6 05/18/2019   CL 99 05/18/2019   CREATININE 1.08 05/18/2019   BUN 14 05/18/2019   CO2 20 05/18/2019   TSH 3.890 01/08/2019      Assessment / Plan:      73 year old male with exertional angina that has progressed over the past several months who is now demonstrated to have severe multivessel coronary artery disease including an 80% left main coronary artery stenosis and relatively well-preserved LV function.  Coronary bypass grafting is his best option for myocardial revascularization for survival benefit and control of his angina.  The procedure was briefly explained to the patient and his questions were answered.  We have tentatively planned for surgery on Monday,  05/24/2019.  He would like for [place] to proceed with planned work-up in anticipation of surgery.  We will order an echocardiogram, carotid Dopplers, and lower extremity arterial ultrasound with ABIs.  The patient will be seen and evaluated further by Dr. Prescott Gum later today.   I  spent 25 minutes counseling the patient face to face.   Antony Odea, PA-C [phone]  05/22/2019 12:22 PM   patient examined and medical record reviewed,agree with above note. Tharon Aquas Trigt III 06/01/2019

## 2019-05-22 NOTE — Progress Notes (Signed)
  Echocardiogram 2D Echocardiogram has been performed.  Tony Holloway 05/22/2019, 3:10 PM

## 2019-05-22 NOTE — Progress Notes (Signed)
Site area: Right groin a 5 french arterial sheath was removed  Site Prior to Removal:  Level 0  Pressure Applied For 20 MINUTES    Bedrest Beginning at 1120am  Manual:   Yes.    Patient Status During Pull:  stable  Post Pull Groin Site:  Level 0  Post Pull Instructions Given:  Yes.    Post Pull Pulses Present:  Yes.    Dressing Applied:  Yes.    Comments:

## 2019-05-22 NOTE — Plan of Care (Signed)

## 2019-05-23 ENCOUNTER — Inpatient Hospital Stay (HOSPITAL_COMMUNITY): Payer: PPO

## 2019-05-23 DIAGNOSIS — I2510 Atherosclerotic heart disease of native coronary artery without angina pectoris: Secondary | ICD-10-CM

## 2019-05-23 DIAGNOSIS — Z01810 Encounter for preprocedural cardiovascular examination: Secondary | ICD-10-CM

## 2019-05-23 LAB — BLOOD GAS, ARTERIAL
Acid-Base Excess: 0.9 mmol/L (ref 0.0–2.0)
Bicarbonate: 25 mmol/L (ref 20.0–28.0)
Drawn by: 347191
FIO2: 21
O2 Saturation: 96.6 %
Patient temperature: 98.6
pCO2 arterial: 39.8 mmHg (ref 32.0–48.0)
pH, Arterial: 7.415 (ref 7.350–7.450)
pO2, Arterial: 84.3 mmHg (ref 83.0–108.0)

## 2019-05-23 LAB — CBC
HCT: 37.8 % — ABNORMAL LOW (ref 39.0–52.0)
Hemoglobin: 12.7 g/dL — ABNORMAL LOW (ref 13.0–17.0)
MCH: 29.3 pg (ref 26.0–34.0)
MCHC: 33.6 g/dL (ref 30.0–36.0)
MCV: 87.1 fL (ref 80.0–100.0)
Platelets: 219 10*3/uL (ref 150–400)
RBC: 4.34 MIL/uL (ref 4.22–5.81)
RDW: 13.3 % (ref 11.5–15.5)
WBC: 10 10*3/uL (ref 4.0–10.5)
nRBC: 0 % (ref 0.0–0.2)

## 2019-05-23 LAB — HEMOGLOBIN A1C
Hgb A1c MFr Bld: 5.7 % — ABNORMAL HIGH (ref 4.8–5.6)
Mean Plasma Glucose: 116.89 mg/dL

## 2019-05-23 LAB — COMPREHENSIVE METABOLIC PANEL
ALT: 20 U/L (ref 0–44)
AST: 27 U/L (ref 15–41)
Albumin: 3 g/dL — ABNORMAL LOW (ref 3.5–5.0)
Alkaline Phosphatase: 72 U/L (ref 38–126)
Anion gap: 7 (ref 5–15)
BUN: 9 mg/dL (ref 8–23)
CO2: 26 mmol/L (ref 22–32)
Calcium: 8.7 mg/dL — ABNORMAL LOW (ref 8.9–10.3)
Chloride: 108 mmol/L (ref 98–111)
Creatinine, Ser: 1.07 mg/dL (ref 0.61–1.24)
GFR calc Af Amer: >60 mL/min (ref >60)
GFR calc non Af Amer: >60 mL/min (ref >60)
Glucose, Bld: 102 mg/dL — ABNORMAL HIGH (ref 70–99)
Potassium: 4.7 mmol/L (ref 3.5–5.1)
Sodium: 141 mmol/L (ref 135–145)
Total Bilirubin: 0.6 mg/dL (ref 0.3–1.2)
Total Protein: 5.2 g/dL — ABNORMAL LOW (ref 6.5–8.1)

## 2019-05-23 LAB — PROTIME-INR
INR: 1 (ref 0.8–1.2)
Prothrombin Time: 13.2 seconds (ref 11.4–15.2)

## 2019-05-23 LAB — TSH: TSH: 3.174 u[IU]/mL (ref 0.350–4.500)

## 2019-05-23 LAB — SURGICAL PCR SCREEN
MRSA, PCR: NEGATIVE
Staphylococcus aureus: NEGATIVE

## 2019-05-23 MED ORDER — ATORVASTATIN CALCIUM 80 MG PO TABS: 80.0000 mg | ORAL_TABLET | Freq: Every day | ORAL | Status: DC

## 2019-05-23 MED ORDER — PRAVASTATIN SODIUM 10 MG PO TABS
20.0000 mg | ORAL_TABLET | Freq: Every day | ORAL | Status: DC
  Administered 2019-05-23 – 2019-05-27 (×4): 20 mg via ORAL
  Filled 2019-05-23 (×4): qty 2

## 2019-05-23 MED ORDER — ATORVASTATIN CALCIUM 40 MG PO TABS: 40.0000 mg | ORAL_TABLET | Freq: Every day | ORAL | Status: DC

## 2019-05-23 NOTE — Plan of Care (Signed)
  Problem: Clinical Measurements: Goal: Respiratory complications will improve Outcome: Completed/Met   Problem: Activity: Goal: Risk for activity intolerance will decrease Outcome: Completed/Met   Problem: Nutrition: Goal: Adequate nutrition will be maintained Outcome: Completed/Met   Problem: Coping: Goal: Level of anxiety will decrease Outcome: Completed/Met   Problem: Elimination: Goal: Will not experience complications related to bowel motility Outcome: Completed/Met Goal: Will not experience complications related to urinary retention Outcome: Completed/Met   Problem: Pain Managment: Goal: General experience of comfort will improve Outcome: Completed/Met   Problem: Safety: Goal: Ability to remain free from injury will improve Outcome: Completed/Met   Problem: Skin Integrity: Goal: Risk for impaired skin integrity will decrease Outcome: Completed/Met

## 2019-05-23 NOTE — Progress Notes (Addendum)
CARDIAC REHAB PHASE I   [phone] F/u regarding preop education.  Pt had reviewed materials and did not have any questions.  Reiterated use of IS, staying in the tube, and walking after surgery.  Pt verbalized understanding.    Noel Christmas, RN 05/23/2019 12:36 PM

## 2019-05-23 NOTE — Progress Notes (Signed)
Progress Note  Patient Name: Tony Holloway Date of Encounter: 05/23/2019  Primary Cardiologist: Dr Geraldo Pitter  Subjective   No complaints  Inpatient Medications    Scheduled Meds: . aspirin  81 mg Oral Daily  . enoxaparin (LOVENOX) injection  40 mg Subcutaneous Q24H  . isosorbide mononitrate  30 mg Oral Daily  . metoprolol succinate  25 mg Oral Daily  . sodium chloride flush  3 mL Intravenous Q12H   Continuous Infusions: . sodium chloride 150 mL/hr at 05/22/19 2135  . sodium chloride     PRN Meds: sodium chloride, acetaminophen, diazepam, ondansetron (ZOFRAN) IV, sodium chloride flush   Vital Signs    Vitals:   05/22/19 1606 05/22/19 1954 05/22/19 2338 05/23/19 0410  BP: 132/71 118/62 111/69 (!) 119/47  Pulse: 65 60 62 66  Resp: 20 18 18 18   Temp: 98 F (36.7 C) 97.7 F (36.5 C) 98.6 F (37 C) 98.1 F (36.7 C)  TempSrc: Oral Oral Oral Oral  SpO2: 98% 99% 96% 95%  Weight:    114.1 kg  Height:        Intake/Output Summary (Last 24 hours) at 05/23/2019 0745 Last data filed at 05/23/2019 2130 Gross per 24 hour  Intake 2726.3 ml  Output 1425 ml  Net 1301.3 ml   Last 3 Weights 05/23/2019 05/22/2019 05/22/2019  Weight (lbs) 251 lb 8 oz 252 lb 248 lb  Weight (kg) 114.08 kg 114.306 kg 112.492 kg      Telemetry    SR- Personally Reviewed  ECG    n/a- Personally Reviewed  Physical Exam   GEN: No acute distress.   Neck: No JVD Cardiac: RRR, no murmurs, rubs, or gallops.  Respiratory: Clear to auscultation bilaterally. GI: Soft, nontender, non-distended  MS: No edema; No deformity. Neuro:  Nonfocal  Psych: Normal affect   Labs    High Sensitivity Troponin:  No results for input(s): TROPONINIHS in the last 720 hours.    Cardiac EnzymesNo results for input(s): TROPONINI in the last 168 hours. No results for input(s): TROPIPOC in the last 168 hours.   Chemistry Recent Labs  Lab 05/18/19 1131 05/22/19 1238 05/23/19 0331  NA 139  --  141  K 4.6  --   4.7  CL 99  --  108  CO2 20  --  26  GLUCOSE 108*  --  102*  BUN 14  --  9  CREATININE 1.08 1.15 1.07  CALCIUM 10.1  --  8.7*  PROT  --   --  5.2*  ALBUMIN  --   --  3.0*  AST  --   --  27  ALT  --   --  20  ALKPHOS  --   --  72  BILITOT  --   --  0.6  GFRNONAA 69 >60 >60  GFRAA 79 >60 >60  ANIONGAP  --   --  7     Hematology Recent Labs  Lab 05/18/19 1131 05/22/19 1238 05/23/19 0331  WBC 8.5 8.7 10.0  RBC 5.25 4.60 4.34  HGB 15.4 13.3 12.7*  HCT 45.0 39.6 37.8*  MCV 86 86.1 87.1  MCH 29.3 28.9 29.3  MCHC 34.2 33.6 33.6  RDW 13.4 13.4 13.3  PLT 277 263 219    BNPNo results for input(s): BNP, PROBNP in the last 168 hours.   DDimer No results for input(s): DDIMER in the last 168 hours.   Radiology    No results found.  Cardiac Studies  Patient Profile     73 y.o. male history of HTN presented for cath due to exertion chest pain. Found to have multivessel disease including LM disease.   Assessment & Plan    1. CAD/Angina - referred for cath for symptoms of exertional angina - 04/2019 cath: ostial LM 80%, prox LAD 50%, LCX 40%, OM1 90%, OM2 95%, prox RCA 95% - 04/2019 echo LVEF 65-68%, grade I diastolic dysfunction - CT surgery has been consulted, awaiting final recs regarding CABG but looks likely for surgery Monday  - medical therapy with ASA 81, toprol 25. Can consider ACE-I after surgery. Severe muscle aches on atorva 80mg  in the past, medicine was stopped. We will try pravastatin 20mg  daily at this time. If tolerates can consider more potent statin in the future.       For questions or updates, please contact [place] Please consult [email or web address] for contact info under        Signed, Carlyle Dolly, MD  05/23/2019, 7:45 AM

## 2019-05-23 NOTE — Progress Notes (Signed)
VASCULAR LAB PRELIMINARY  PRELIMINARY  PRELIMINARY  PRELIMINARY  Pre CABG Dopplers completed.    Preliminary report:  See CV proc for preliminary results.   Nathen Balaban, RVT 05/23/2019, 5:23 PM

## 2019-05-24 ENCOUNTER — Encounter (HOSPITAL_COMMUNITY): Payer: Self-pay | Admitting: Anesthesiology

## 2019-05-24 LAB — PREPARE RBC (CROSSMATCH)

## 2019-05-24 LAB — SARS CORONAVIRUS 2 BY RT PCR (HOSPITAL ORDER, PERFORMED IN ~~LOC~~ HOSPITAL LAB): SARS Coronavirus 2: NEGATIVE

## 2019-05-24 LAB — ABO/RH: ABO/RH(D): O POS

## 2019-05-24 MED ORDER — INSULIN REGULAR(HUMAN) IN NACL 100-0.9 UT/100ML-% IV SOLN
INTRAVENOUS | Status: AC
  Administered 2019-05-25: .8 [IU]/h via INTRAVENOUS
  Filled 2019-05-24 (×3): qty 100

## 2019-05-24 MED ORDER — NITROGLYCERIN IN D5W 200-5 MCG/ML-% IV SOLN
2.0000 ug/min | INTRAVENOUS | Status: AC
  Administered 2019-05-25: 5 ug/min via INTRAVENOUS
  Filled 2019-05-24 (×3): qty 250

## 2019-05-24 MED ORDER — MILRINONE LACTATE IN DEXTROSE 20-5 MG/100ML-% IV SOLN
0.3000 ug/kg/min | INTRAVENOUS | Status: AC
  Administered 2019-05-25: 0.125 ug/kg/min via INTRAVENOUS
  Filled 2019-05-24 (×2): qty 100

## 2019-05-24 MED ORDER — DOPAMINE-DEXTROSE 3.2-5 MG/ML-% IV SOLN
0.0000 ug/kg/min | INTRAVENOUS | Status: DC
  Filled 2019-05-24 (×2): qty 250

## 2019-05-24 MED ORDER — ALPRAZOLAM 0.25 MG PO TABS: 0.2500 mg | ORAL_TABLET | ORAL | Status: DC | PRN

## 2019-05-24 MED ORDER — TRANEXAMIC ACID (OHS) PUMP PRIME SOLUTION
2.0000 mg/kg | INTRAVENOUS | Status: DC
  Filled 2019-05-24 (×2): qty 2.24

## 2019-05-24 MED ORDER — SODIUM CHLORIDE 0.9 % IV SOLN
750.0000 mg | INTRAVENOUS | Status: DC
  Filled 2019-05-24: qty 750

## 2019-05-24 MED ORDER — CHLORHEXIDINE GLUCONATE 4 % EX LIQD
60.0000 mL | Freq: Once | CUTANEOUS | Status: AC
  Administered 2019-05-25: 4 via TOPICAL
  Filled 2019-05-24: qty 60

## 2019-05-24 MED ORDER — SODIUM CHLORIDE 0.9 % IV SOLN
750.0000 mg | INTRAVENOUS | Status: DC
  Filled 2019-05-24 (×2): qty 750

## 2019-05-24 MED ORDER — TRANEXAMIC ACID (OHS) BOLUS VIA INFUSION
15.0000 mg/kg | INTRAVENOUS | Status: DC
  Filled 2019-05-24 (×2): qty 1680

## 2019-05-24 MED ORDER — MAGNESIUM SULFATE 50 % IJ SOLN
40.0000 meq | INTRAMUSCULAR | Status: DC
  Filled 2019-05-24: qty 9.85

## 2019-05-24 MED ORDER — EPINEPHRINE PF 1 MG/ML IJ SOLN
0.0000 ug/min | INTRAVENOUS | Status: DC
  Filled 2019-05-24 (×2): qty 4

## 2019-05-24 MED ORDER — PHENYLEPHRINE HCL-NACL 20-0.9 MG/250ML-% IV SOLN
30.0000 ug/min | INTRAVENOUS | Status: AC
  Administered 2019-05-25: 20 ug/min via INTRAVENOUS
  Filled 2019-05-24 (×2): qty 250

## 2019-05-24 MED ORDER — TEMAZEPAM 15 MG PO CAPS
15.0000 mg | ORAL_CAPSULE | Freq: Once | ORAL | Status: AC | PRN
  Administered 2019-05-24: 15 mg via ORAL
  Filled 2019-05-24: qty 1

## 2019-05-24 MED ORDER — CHLORHEXIDINE GLUCONATE 4 % EX LIQD
60.0000 mL | Freq: Once | CUTANEOUS | Status: AC
  Administered 2019-05-24: 4 via TOPICAL
  Filled 2019-05-24: qty 60

## 2019-05-24 MED ORDER — POTASSIUM CHLORIDE 2 MEQ/ML IV SOLN
80.0000 meq | INTRAVENOUS | Status: DC
  Filled 2019-05-24 (×2): qty 40

## 2019-05-24 MED ORDER — SODIUM CHLORIDE 0.9 % IV SOLN
INTRAVENOUS | Status: DC
  Filled 2019-05-24 (×2): qty 30

## 2019-05-24 MED ORDER — NOREPINEPHRINE 4 MG/250ML-% IV SOLN
0.0000 ug/min | INTRAVENOUS | Status: DC
  Filled 2019-05-24 (×3): qty 250

## 2019-05-24 MED ORDER — SODIUM CHLORIDE 0.9 % IV SOLN
1.5000 g | INTRAVENOUS | Status: AC
  Administered 2019-05-25: .75 g via INTRAVENOUS
  Administered 2019-05-25: 1.5 g via INTRAVENOUS
  Filled 2019-05-24 (×2): qty 1.5

## 2019-05-24 MED ORDER — PLASMA-LYTE 148 IV SOLN
INTRAVENOUS | Status: AC
  Administered 2019-05-25: 500 mL
  Filled 2019-05-24 (×3): qty 2.5

## 2019-05-24 MED ORDER — DIAZEPAM 5 MG PO TABS
10.0000 mg | ORAL_TABLET | Freq: Once | ORAL | Status: AC
  Administered 2019-05-25: 05:00:00 10 mg via ORAL
  Filled 2019-05-24: qty 2

## 2019-05-24 MED ORDER — TRANEXAMIC ACID 1000 MG/10ML IV SOLN
1.5000 mg/kg/h | INTRAVENOUS | Status: AC
  Administered 2019-05-25: 15 mg/kg/h via INTRAVENOUS
  Administered 2019-05-25: 13:00:00 via INTRAVENOUS
  Filled 2019-05-24 (×2): qty 25

## 2019-05-24 MED ORDER — BISACODYL 5 MG PO TBEC
5.0000 mg | DELAYED_RELEASE_TABLET | Freq: Once | ORAL | Status: AC
  Administered 2019-05-24: 13:00:00 5 mg via ORAL
  Filled 2019-05-24: qty 1

## 2019-05-24 MED ORDER — DEXMEDETOMIDINE HCL IN NACL 400 MCG/100ML IV SOLN
0.1000 ug/kg/h | INTRAVENOUS | Status: AC
  Administered 2019-05-25: .2 ug/kg/h via INTRAVENOUS
  Filled 2019-05-24 (×2): qty 100

## 2019-05-24 MED ORDER — CHLORHEXIDINE GLUCONATE 0.12 % MT SOLN
15.0000 mL | Freq: Once | OROMUCOSAL | Status: AC
  Administered 2019-05-25: 15 mL via OROMUCOSAL
  Filled 2019-05-24: qty 15

## 2019-05-24 MED ORDER — METOPROLOL TARTRATE 12.5 MG HALF TABLET
12.5000 mg | ORAL_TABLET | Freq: Once | ORAL | Status: AC
  Administered 2019-05-25: 12.5 mg via ORAL
  Filled 2019-05-24: qty 1

## 2019-05-24 MED ORDER — VANCOMYCIN HCL 10 G IV SOLR
1500.0000 mg | INTRAVENOUS | Status: AC
  Administered 2019-05-25: 1500 mg via INTRAVENOUS
  Filled 2019-05-24 (×3): qty 1500

## 2019-05-24 NOTE — Plan of Care (Signed)
  Problem: Clinical Measurements: Goal: Will remain free from infection Outcome: Completed/Met

## 2019-05-24 NOTE — Anesthesia Preprocedure Evaluation (Addendum)
Anesthesia Evaluation  Patient identified by MRN, date of birth, ID band Patient awake    Reviewed: Allergy & Precautions, NPO status , Patient's Chart, lab work & pertinent test results  Airway Mallampati: II  TM Distance: >3 FB Neck ROM: Full    Dental  (+) Edentulous Upper, Edentulous Lower   Pulmonary former smoker,    breath sounds clear to auscultation       Cardiovascular hypertension, + CAD   Rhythm:Regular Rate:Normal     Neuro/Psych negative neurological ROS  negative psych ROS   GI/Hepatic negative GI ROS, Neg liver ROS,   Endo/Other  negative endocrine ROS  Renal/GU negative Renal ROS     Musculoskeletal negative musculoskeletal ROS (+)   Abdominal (+) + obese,   Peds  Hematology negative hematology ROS (+)   Anesthesia Other Findings   Reproductive/Obstetrics negative OB ROS                            Lab Results  Component Value Date   WBC 10.0 05/23/2019   HGB 12.7 (L) 05/23/2019   HCT 37.8 (L) 05/23/2019   MCV 87.1 05/23/2019   PLT 219 05/23/2019   Lab Results  Component Value Date   CREATININE 1.07 05/23/2019   BUN 9 05/23/2019   NA 141 05/23/2019   K 4.7 05/23/2019   CL 108 05/23/2019   CO2 26 05/23/2019   Lab Results  Component Value Date   INR 1.0 05/23/2019   Echo: 1. The left ventricle has normal systolic function, with an ejection fraction of 55-60%. The cavity size was normal. Left ventricular diastolic Doppler parameters are consistent with impaired relaxation. No evidence of left ventricular regional wall  motion abnormalities.  2. The right ventricle has normal systolic function. The cavity was mildly enlarged. There is no increase in right ventricular wall thickness.  3. No evidence of mitral valve stenosis. Trivial regurgitation.  4. The aortic valve is tricuspid. No stenosis of the aortic valve.  5. The aortic root is normal in size and  structure.  6. The IVC was not visualized. No complete TR doppler jet so unable to estimate PA systolic pressure.  Anesthesia Physical Anesthesia Plan  ASA: IV  Anesthesia Plan: General   Post-op Pain Management:    Induction: Intravenous  PONV Risk Score and Plan: 2 and Ondansetron and Midazolam  Airway Management Planned: Oral ETT  Additional Equipment: Arterial line, CVP, PA Cath, TEE and Ultrasound Guidance Line Placement  Intra-op Plan:   Post-operative Plan: Post-operative intubation/ventilation  Informed Consent: I have reviewed the patients History and Physical, chart, labs and discussed the procedure including the risks, benefits and alternatives for the proposed anesthesia with the patient or authorized representative who has indicated his/her understanding and acceptance.       Plan Discussed with: CRNA  Anesthesia Plan Comments: (COVID-19 Labs  No results for input(s): DDIMER, FERRITIN, LDH, CRP in the last 72 hours.  Lab Results      Component                Value               Date                      [place]              NEGATIVE            05/24/2019            )  Anesthesia Quick Evaluation  

## 2019-05-24 NOTE — Progress Notes (Signed)
2 Days Post-Op Procedure(s) (LRB): LEFT HEART CATH AND CORONARY ANGIOGRAPHY (N/A) Subjective: Patient without chest pain Preoperative testing completed for CABG planned in a.m. 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  Plan CABG with grafts to LAD, ramus, circumflex, and posterior descending in a.m.  Patient understands the risks and benefits of surgery and agrees to proceed.  Objective: Vital signs in last 24 hours: Temp:  [97.6 F (36.4 C)-98.6 F (37 C)] 97.6 F (36.4 C) (06/28 1114) Pulse Rate:  [64-72] 64 (06/28 1114) Cardiac Rhythm: Normal sinus rhythm (06/27 2105) Resp:  [18-20] 20 (06/28 1114) BP: (124-156)/(67-80) 128/70 (06/28 1114) SpO2:  [93 %-99 %] 97 % (06/28 1114) Weight:  [149 kg] 112 kg (06/28 0412)  Hemodynamic parameters for last 24 hours:    Intake/Output from previous day: 06/27 0701 - 06/28 0700 In: 1040 [P.O.:1040] Out: 1600 [Urine:1600] Intake/Output this shift: Total I/O In: 924 [P.O.:924] Out: 900 [Urine:900]       Exam    General- alert and comfortable    Neck- no JVD, no cervical adenopathy palpable, no carotid bruit   Lungs- clear without rales, wheezes   Cor- regular rate and rhythm, no murmur , gallop   Abdomen- soft, non-tender   Extremities - warm, non-tender, minimal edema   Neuro- oriented, appropriate, no focal weakness   Lab Results: Recent Labs    05/22/19 1238 05/23/19 0331  WBC 8.7 10.0  HGB 13.3 12.7*  HCT 39.6 37.8*  PLT 263 219   BMET:  Recent Labs    05/22/19 1238 05/23/19 0331  NA  --  141  K  --  4.7  CL  --  108  CO2  --  26  GLUCOSE  --  102*  BUN  --  9  CREATININE 1.15 1.07  CALCIUM  --  8.7*    PT/INR:  Recent Labs    05/23/19 0331  LABPROT 13.2  INR 1.0   ABG    Component Value Date/Time   PHART 7.415 05/23/2019 0500   HCO3 25.0 05/23/2019 0500   O2SAT 96.6 05/23/2019 0500   CBG (last 3)  No results for input(s): GLUCAP in the last 72  hours.  Assessment/Plan: S/P Procedure(s) (LRB): LEFT HEART CATH AND CORONARY ANGIOGRAPHY (N/A) CABG in a.m. Orders placed   LOS: 2 days    Tony Holloway 05/24/2019

## 2019-05-24 NOTE — Plan of Care (Signed)
  Problem: Education: Goal: Knowledge of General Education information will improve Description Including pain rating scale, medication(s)/side effects and non-pharmacologic comfort measures Outcome: Progressing   

## 2019-05-24 NOTE — Progress Notes (Signed)
Progress Note  Patient Name: Tony Holloway Date of Encounter: 05/24/2019  Primary Cardiologist: Dr Geraldo Pitter  Subjective   No complaints  Inpatient Medications    Scheduled Meds: . aspirin  81 mg Oral Daily  . enoxaparin (LOVENOX) injection  40 mg Subcutaneous Q24H  . isosorbide mononitrate  30 mg Oral Daily  . metoprolol succinate  25 mg Oral Daily  . pravastatin  20 mg Oral q1800  . sodium chloride flush  3 mL Intravenous Q12H   Continuous Infusions: . sodium chloride 150 mL/hr at 05/22/19 2135  . sodium chloride     PRN Meds: sodium chloride, acetaminophen, diazepam, ondansetron (ZOFRAN) IV, sodium chloride flush   Vital Signs    Vitals:   05/23/19 1621 05/23/19 1933 05/24/19 0411 05/24/19 0412  BP: 136/80 124/70 130/67   Pulse: 66 65 64   Resp: 19 18 18    Temp: 97.9 F (36.6 C) 98.2 F (36.8 C) 98.6 F (37 C)   TempSrc: Oral Oral Oral   SpO2: 99% 99% 96%   Weight:    112 kg  Height:        Intake/Output Summary (Last 24 hours) at 05/24/2019 0711 Last data filed at 05/24/2019 8756 Gross per 24 hour  Intake 940 ml  Output 1000 ml  Net -60 ml   Last 3 Weights 05/24/2019 05/23/2019 05/22/2019  Weight (lbs) 246 lb 14.4 oz 251 lb 8 oz 252 lb  Weight (kg) 111.993 kg 114.08 kg 114.306 kg      Telemetry    NSR - Personally Reviewed  ECG    n/a - Personally Reviewed  Physical Exam   GEN: No acute distress.   Neck: No JVD Cardiac: RRR, no murmurs, rubs, or gallops.  Respiratory: Clear to auscultation bilaterally. GI: Soft, nontender, non-distended  MS: No edema; No deformity. Neuro:  Nonfocal  Psych: Normal affect   Labs    High Sensitivity Troponin:  No results for input(s): TROPONINIHS in the last 720 hours.    Cardiac EnzymesNo results for input(s): TROPONINI in the last 168 hours. No results for input(s): TROPIPOC in the last 168 hours.   Chemistry Recent Labs  Lab 05/18/19 1131 05/22/19 1238 05/23/19 0331  NA 139  --  141  K 4.6  --   4.7  CL 99  --  108  CO2 20  --  26  GLUCOSE 108*  --  102*  BUN 14  --  9  CREATININE 1.08 1.15 1.07  CALCIUM 10.1  --  8.7*  PROT  --   --  5.2*  ALBUMIN  --   --  3.0*  AST  --   --  27  ALT  --   --  20  ALKPHOS  --   --  72  BILITOT  --   --  0.6  GFRNONAA 69 >60 >60  GFRAA 79 >60 >60  ANIONGAP  --   --  7     Hematology Recent Labs  Lab 05/18/19 1131 05/22/19 1238 05/23/19 0331  WBC 8.5 8.7 10.0  RBC 5.25 4.60 4.34  HGB 15.4 13.3 12.7*  HCT 45.0 39.6 37.8*  MCV 86 86.1 87.1  MCH 29.3 28.9 29.3  MCHC 34.2 33.6 33.6  RDW 13.4 13.4 13.3  PLT 277 263 219    BNPNo results for input(s): BNP, PROBNP in the last 168 hours.   DDimer No results for input(s): DDIMER in the last 168 hours.   Radiology  Dg Chest 2 View  Result Date: 05/23/2019 CLINICAL DATA:  Chest pain due to myocardial ischemia. EXAM: CHEST - 2 VIEW COMPARISON:  Chest x-ray dated 05/18/2019. FINDINGS: Borderline cardiomegaly, stable. Overall cardiomediastinal silhouette is stable in size and configuration. Lungs are clear. No pleural effusion or pneumothorax seen. Mild degenerative spondylosis of the kyphotic thoracic spine. No acute or suspicious osseous finding. IMPRESSION: No active cardiopulmonary disease. No evidence of pneumonia or pulmonary edema. Electronically Signed   By: Franki Cabot M.D.   On: 05/23/2019 08:33   Vas US Doppler Pre Cabg  Result Date: 05/23/2019 PREOPERATIVE VASCULAR EVALUATION  Risk Factors:     Hypertension, coronary artery disease. Comparison Study: No prior study on file for comparison. Performing Technologist: Sharion Dove RVS  Examination Guidelines: A complete evaluation includes B-mode imaging, spectral Doppler, color Doppler, and power Doppler as needed of all accessible portions of each vessel. Bilateral testing is considered an integral part of a complete examination. Limited examinations for reoccurring indications may be performed as noted.  Right Carotid  Findings: +----------+--------+--------+--------+--------+------------------+           PSV cm/sEDV cm/sStenosisDescribeComments           +----------+--------+--------+--------+--------+------------------+ CCA Prox  63      11                      intimal thickening +----------+--------+--------+--------+--------+------------------+ CCA Distal68      10                      intimal thickening +----------+--------+--------+--------+--------+------------------+ ICA Prox  58      13                                         +----------+--------+--------+--------+--------+------------------+ ICA Distal79      22                                         +----------+--------+--------+--------+--------+------------------+ ECA       143     13                                         +----------+--------+--------+--------+--------+------------------+ Portions of this table do not appear on this page. +----------+--------+-------+--------+------------+           PSV cm/sEDV cmsDescribeArm Pressure +----------+--------+-------+--------+------------+ Subclavian91                     141          +----------+--------+-------+--------+------------+ +---------+--------+--+--------+--+ VertebralPSV cm/s41EDV cm/s10 +---------+--------+--+--------+--+ Left Carotid Findings: +----------+--------+--------+--------+--------+------------------+           PSV cm/sEDV cm/sStenosisDescribeComments           +----------+--------+--------+--------+--------+------------------+ CCA Prox  64      14                      intimal thickening +----------+--------+--------+--------+--------+------------------+ CCA Distal55      13                      intimal thickening +----------+--------+--------+--------+--------+------------------+ ICA Prox  57      15                                         +----------+--------+--------+--------+--------+------------------+  ICA Distal58      16                                         +----------+--------+--------+--------+--------+------------------+ ECA       126     16                                         +----------+--------+--------+--------+--------+------------------+ +----------+--------+--------+--------+------------+ SubclavianPSV cm/sEDV cm/sDescribeArm Pressure +----------+--------+--------+--------+------------+           169                     140          +----------+--------+--------+--------+------------+ +---------+--------+--+--------+-+ VertebralPSV cm/s42EDV cm/s9 +---------+--------+--+--------+-+  ABI Findings: +--------+------------------+-----+---------+--------+ Right   Rt Pressure (mmHg)IndexWaveform Comment  +--------+------------------+-----+---------+--------+ [id number]                    triphasic         +--------+------------------+-----+---------+--------+ PTA                            triphasic         +--------+------------------+-----+---------+--------+ DP                             triphasic         +--------+------------------+-----+---------+--------+ +--------+------------------+-----+---------+-------+ Left    Lt Pressure (mmHg)IndexWaveform Comment +--------+------------------+-----+---------+-------+ Brachial140                    triphasic        +--------+------------------+-----+---------+-------+ PTA                            triphasic        +--------+------------------+-----+---------+-------+ DP                             triphasic        +--------+------------------+-----+---------+-------+  Right Doppler Findings: +-----------+--------+-----+---------+--------+ Site       PressureIndexDoppler  Comments +-----------+--------+-----+---------+--------+ Brachial   141          triphasic         +-----------+--------+-----+---------+--------+ Forearm                 triphasic          +-----------+--------+-----+---------+--------+ Radial                  triphasic         +-----------+--------+-----+---------+--------+ Ulnar                   triphasic         +-----------+--------+-----+---------+--------+ Palmar Arch                      WNL      +-----------+--------+-----+---------+--------+  Left Doppler Findings: +-----------+--------+-----+---------+-----------------------------------------+ Site       PressureIndexDoppler  Comments                                  +-----------+--------+-----+---------+-----------------------------------------+ Brachial   140  triphasic                                          +-----------+--------+-----+---------+-----------------------------------------+ Forearm                 triphasic                                          +-----------+--------+-----+---------+-----------------------------------------+ Radial                  triphasic                                          +-----------+--------+-----+---------+-----------------------------------------+ Ulnar                   triphasic                                          +-----------+--------+-----+---------+-----------------------------------------+ Palmar Arch                      Doppler signal remains normal with radial                                  compression and diminishes >50% with                                       ulnar compression                         +-----------+--------+-----+---------+-----------------------------------------+  Summary: Right Carotid: The extracranial vessels were near-normal with only minimal wall                thickening or plaque. Left Carotid: The extracranial vessels were near-normal with only minimal wall               thickening or plaque. Vertebrals:  Bilateral vertebral arteries demonstrate antegrade flow. Subclavians: Normal flow hemodynamics were seen in bilateral  subclavian              arteries. Right Upper Extremity: Doppler waveforms remain within normal limits with right radial compression. Doppler waveforms remain within normal limits with right ulnar compression. Left Upper Extremity: Doppler waveforms remain within normal limits with left radial compression. Doppler waveforms decrease >50% with left ulnar compression.     Preliminary     Cardiac Studies     Patient Profile     73 y.o. male history of HTN presented for cath due to exertion chest pain. Found to have multivessel disease including LM disease.   Assessment & Plan    1. CAD/Angina - referred for cath for symptoms of exertional angina - 04/2019 cath: ostial LM 80%, prox LAD 50%, LCX 40%, OM1 90%, OM2 95%, prox RCA 95% - 04/2019 echo LVEF 37-85%, grade I diastolic dysfunction - CT surgery has been consulted, plans for CABG Monday  - medical therapy with ASA 81, toprol  25. Can consider ACE-I after surgery. Severe muscle aches on atorva 80mg  in the past, medicine was stopped. We will try pravastatin 20mg  daily at this time. If tolerates can consider more potent statin in the future.   - plans for CABG tomorrow per CT surgery    For questions or updates, please contact [facility] Please consult [email or web address] for contact info under        Signed, Carlyle Dolly, MD  05/24/2019, 7:11 AM

## 2019-05-25 ENCOUNTER — Inpatient Hospital Stay (HOSPITAL_COMMUNITY): Payer: PPO | Admitting: Anesthesiology

## 2019-05-25 ENCOUNTER — Inpatient Hospital Stay (HOSPITAL_COMMUNITY): Payer: PPO

## 2019-05-25 ENCOUNTER — Telehealth: Payer: Self-pay

## 2019-05-25 ENCOUNTER — Encounter (HOSPITAL_COMMUNITY): Payer: Self-pay | Admitting: Anesthesiology

## 2019-05-25 ENCOUNTER — Inpatient Hospital Stay (HOSPITAL_COMMUNITY)
Admission: AD | Disposition: A | Discharge status: Home / Self Care | Payer: Self-pay | Source: Home / Self Care | Attending: Cardiothoracic Surgery

## 2019-05-25 DIAGNOSIS — I208 Other forms of angina pectoris: Secondary | ICD-10-CM

## 2019-05-25 DIAGNOSIS — I2510 Atherosclerotic heart disease of native coronary artery without angina pectoris: Secondary | ICD-10-CM

## 2019-05-25 HISTORY — PX: CORONARY ARTERY BYPASS GRAFT: SHX141

## 2019-05-25 HISTORY — PX: TEE WITHOUT CARDIOVERSION: SHX5443

## 2019-05-25 LAB — POCT I-STAT 4, (NA,K, GLUC, HGB,HCT)
Glucose, Bld: 127 mg/dL — ABNORMAL HIGH (ref 70–99)
Glucose, Bld: 122 mg/dL — ABNORMAL HIGH (ref 70–99)
Glucose, Bld: 133 mg/dL — ABNORMAL HIGH (ref 70–99)
Glucose, Bld: 160 mg/dL — ABNORMAL HIGH (ref 70–99)
Glucose, Bld: 159 mg/dL — ABNORMAL HIGH (ref 70–99)
Glucose, Bld: 138 mg/dL — ABNORMAL HIGH (ref 70–99)
Glucose, Bld: 122 mg/dL — ABNORMAL HIGH (ref 70–99)
Glucose, Bld: 109 mg/dL — ABNORMAL HIGH (ref 70–99)
HCT: 37 % — ABNORMAL LOW (ref 39.0–52.0)
HCT: 31 % — ABNORMAL LOW (ref 39.0–52.0)
HCT: 35 % — ABNORMAL LOW (ref 39.0–52.0)
HCT: 28 % — ABNORMAL LOW (ref 39.0–52.0)
HCT: 29 % — ABNORMAL LOW (ref 39.0–52.0)
HCT: 28 % — ABNORMAL LOW (ref 39.0–52.0)
HCT: 28 % — ABNORMAL LOW (ref 39.0–52.0)
HCT: 30 % — ABNORMAL LOW (ref 39.0–52.0)
Hemoglobin: 12.6 g/dL — ABNORMAL LOW (ref 13.0–17.0)
Hemoglobin: 10.5 g/dL — ABNORMAL LOW (ref 13.0–17.0)
Hemoglobin: 11.9 g/dL — ABNORMAL LOW (ref 13.0–17.0)
Hemoglobin: 9.5 g/dL — ABNORMAL LOW (ref 13.0–17.0)
Hemoglobin: 9.9 g/dL — ABNORMAL LOW (ref 13.0–17.0)
Hemoglobin: 9.5 g/dL — ABNORMAL LOW (ref 13.0–17.0)
Hemoglobin: 9.5 g/dL — ABNORMAL LOW (ref 13.0–17.0)
Hemoglobin: 10.2 g/dL — ABNORMAL LOW (ref 13.0–17.0)
Potassium: 4.1 mmol/L (ref 3.5–5.1)
Potassium: 4.2 mmol/L (ref 3.5–5.1)
Potassium: 4.4 mmol/L (ref 3.5–5.1)
Potassium: 4.8 mmol/L (ref 3.5–5.1)
Potassium: 4.4 mmol/L (ref 3.5–5.1)
Potassium: 4.5 mmol/L (ref 3.5–5.1)
Potassium: 4 mmol/L (ref 3.5–5.1)
Potassium: 4.1 mmol/L (ref 3.5–5.1)
Sodium: 139 mmol/L (ref 135–145)
Sodium: 138 mmol/L (ref 135–145)
Sodium: 139 mmol/L (ref 135–145)
Sodium: 136 mmol/L (ref 135–145)
Sodium: 138 mmol/L (ref 135–145)
Sodium: 137 mmol/L (ref 135–145)
Sodium: 139 mmol/L (ref 135–145)
Sodium: 140 mmol/L (ref 135–145)

## 2019-05-25 LAB — POCT I-STAT 7, (LYTES, BLD GAS, ICA,H+H)
Acid-Base Excess: 4 mmol/L — ABNORMAL HIGH (ref 0.0–2.0)
Acid-Base Excess: 2 mmol/L (ref 0.0–2.0)
Acid-base deficit: 2 mmol/L (ref 0.0–2.0)
Acid-base deficit: 1 mmol/L (ref 0.0–2.0)
Acid-base deficit: 2 mmol/L (ref 0.0–2.0)
Acid-base deficit: 2 mmol/L (ref 0.0–2.0)
Bicarbonate: 29.5 mmol/L — ABNORMAL HIGH (ref 20.0–28.0)
Bicarbonate: 26.7 mmol/L (ref 20.0–28.0)
Bicarbonate: 23.3 mmol/L (ref 20.0–28.0)
Bicarbonate: 24.9 mmol/L (ref 20.0–28.0)
Bicarbonate: 23.6 mmol/L (ref 20.0–28.0)
Bicarbonate: 23.6 mmol/L (ref 20.0–28.0)
Calcium, Ion: 1.06 mmol/L — ABNORMAL LOW (ref 1.15–1.40)
Calcium, Ion: 1.14 mmol/L — ABNORMAL LOW (ref 1.15–1.40)
Calcium, Ion: 1.36 mmol/L (ref 1.15–1.40)
Calcium, Ion: 1.23 mmol/L (ref 1.15–1.40)
Calcium, Ion: 1.21 mmol/L (ref 1.15–1.40)
Calcium, Ion: 1.23 mmol/L (ref 1.15–1.40)
HCT: 29 % — ABNORMAL LOW (ref 39.0–52.0)
HCT: 28 % — ABNORMAL LOW (ref 39.0–52.0)
HCT: 26 % — ABNORMAL LOW (ref 39.0–52.0)
HCT: 30 % — ABNORMAL LOW (ref 39.0–52.0)
HCT: 29 % — ABNORMAL LOW (ref 39.0–52.0)
HCT: 30 % — ABNORMAL LOW (ref 39.0–52.0)
Hemoglobin: 9.9 g/dL — ABNORMAL LOW (ref 13.0–17.0)
Hemoglobin: 9.5 g/dL — ABNORMAL LOW (ref 13.0–17.0)
Hemoglobin: 8.8 g/dL — ABNORMAL LOW (ref 13.0–17.0)
Hemoglobin: 10.2 g/dL — ABNORMAL LOW (ref 13.0–17.0)
Hemoglobin: 9.9 g/dL — ABNORMAL LOW (ref 13.0–17.0)
Hemoglobin: 10.2 g/dL — ABNORMAL LOW (ref 13.0–17.0)
O2 Saturation: 100 %
O2 Saturation: 100 %
O2 Saturation: 98 %
O2 Saturation: 99 %
O2 Saturation: 97 %
O2 Saturation: 97 %
Patient temperature: 36.1
Patient temperature: 37.1
Patient temperature: 37.3
Potassium: 4.4 mmol/L (ref 3.5–5.1)
Potassium: 4.6 mmol/L (ref 3.5–5.1)
Potassium: 4 mmol/L (ref 3.5–5.1)
Potassium: 4.3 mmol/L (ref 3.5–5.1)
Potassium: 4.4 mmol/L (ref 3.5–5.1)
Potassium: 4.3 mmol/L (ref 3.5–5.1)
Sodium: 139 mmol/L (ref 135–145)
Sodium: 137 mmol/L (ref 135–145)
Sodium: 139 mmol/L (ref 135–145)
Sodium: 140 mmol/L (ref 135–145)
Sodium: 139 mmol/L (ref 135–145)
Sodium: 139 mmol/L (ref 135–145)
TCO2: 31 mmol/L (ref 22–32)
TCO2: 28 mmol/L (ref 22–32)
TCO2: 25 mmol/L (ref 22–32)
TCO2: 26 mmol/L (ref 22–32)
TCO2: 25 mmol/L (ref 22–32)
TCO2: 25 mmol/L (ref 22–32)
pCO2 arterial: 46.4 mmHg (ref 32.0–48.0)
pCO2 arterial: 43.6 mmHg (ref 32.0–48.0)
pCO2 arterial: 43.6 mmHg (ref 32.0–48.0)
pCO2 arterial: 44 mmHg (ref 32.0–48.0)
pCO2 arterial: 44.8 mmHg (ref 32.0–48.0)
pCO2 arterial: 45.7 mmHg (ref 32.0–48.0)
pH, Arterial: 7.412 (ref 7.350–7.450)
pH, Arterial: 7.395 (ref 7.350–7.450)
pH, Arterial: 7.335 — ABNORMAL LOW (ref 7.350–7.450)
pH, Arterial: 7.356 (ref 7.350–7.450)
pH, Arterial: 7.329 — ABNORMAL LOW (ref 7.350–7.450)
pH, Arterial: 7.322 — ABNORMAL LOW (ref 7.350–7.450)
pO2, Arterial: 378 mmHg — ABNORMAL HIGH (ref 83.0–108.0)
pO2, Arterial: 312 mmHg — ABNORMAL HIGH (ref 83.0–108.0)
pO2, Arterial: 112 mmHg — ABNORMAL HIGH (ref 83.0–108.0)
pO2, Arterial: 154 mmHg — ABNORMAL HIGH (ref 83.0–108.0)
pO2, Arterial: 102 mmHg (ref 83.0–108.0)
pO2, Arterial: 98 mmHg (ref 83.0–108.0)

## 2019-05-25 LAB — CBC
HCT: 39.3 % (ref 39.0–52.0)
HCT: 31.1 % — ABNORMAL LOW (ref 39.0–52.0)
HCT: 30.2 % — ABNORMAL LOW (ref 39.0–52.0)
Hemoglobin: 13 g/dL (ref 13.0–17.0)
Hemoglobin: 10.2 g/dL — ABNORMAL LOW (ref 13.0–17.0)
Hemoglobin: 10.1 g/dL — ABNORMAL LOW (ref 13.0–17.0)
MCH: 28.4 pg (ref 26.0–34.0)
MCH: 28.8 pg (ref 26.0–34.0)
MCH: 29.3 pg (ref 26.0–34.0)
MCHC: 33.1 g/dL (ref 30.0–36.0)
MCHC: 32.8 g/dL (ref 30.0–36.0)
MCHC: 33.4 g/dL (ref 30.0–36.0)
MCV: 85.8 fL (ref 80.0–100.0)
MCV: 87.9 fL (ref 80.0–100.0)
MCV: 87.5 fL (ref 80.0–100.0)
Platelets: 252 10*3/uL (ref 150–400)
Platelets: 194 10*3/uL (ref 150–400)
Platelets: 223 10*3/uL (ref 150–400)
RBC: 4.58 MIL/uL (ref 4.22–5.81)
RBC: 3.54 MIL/uL — ABNORMAL LOW (ref 4.22–5.81)
RBC: 3.45 MIL/uL — ABNORMAL LOW (ref 4.22–5.81)
RDW: 13.1 % (ref 11.5–15.5)
RDW: 13.1 % (ref 11.5–15.5)
RDW: 13.3 % (ref 11.5–15.5)
WBC: 9.9 10*3/uL (ref 4.0–10.5)
WBC: 18.3 10*3/uL — ABNORMAL HIGH (ref 4.0–10.5)
WBC: 16.2 10*3/uL — ABNORMAL HIGH (ref 4.0–10.5)
nRBC: 0 % (ref 0.0–0.2)
nRBC: 0 % (ref 0.0–0.2)
nRBC: 0 % (ref 0.0–0.2)

## 2019-05-25 LAB — APTT
aPTT: 37 seconds — ABNORMAL HIGH (ref 24–36)
aPTT: 28 seconds (ref 24–36)

## 2019-05-25 LAB — GLUCOSE, CAPILLARY
Glucose-Capillary: 106 mg/dL — ABNORMAL HIGH (ref 70–99)
Glucose-Capillary: 112 mg/dL — ABNORMAL HIGH (ref 70–99)
Glucose-Capillary: 113 mg/dL — ABNORMAL HIGH (ref 70–99)
Glucose-Capillary: 119 mg/dL — ABNORMAL HIGH (ref 70–99)
Glucose-Capillary: 120 mg/dL — ABNORMAL HIGH (ref 70–99)
Glucose-Capillary: 124 mg/dL — ABNORMAL HIGH (ref 70–99)
Glucose-Capillary: 129 mg/dL — ABNORMAL HIGH (ref 70–99)
Glucose-Capillary: 151 mg/dL — ABNORMAL HIGH (ref 70–99)

## 2019-05-25 LAB — BASIC METABOLIC PANEL
Anion gap: 8 (ref 5–15)
BUN: 12 mg/dL (ref 8–23)
CO2: 26 mmol/L (ref 22–32)
Calcium: 9.2 mg/dL (ref 8.9–10.3)
Chloride: 105 mmol/L (ref 98–111)
Creatinine, Ser: 1.07 mg/dL (ref 0.61–1.24)
GFR calc Af Amer: >60 mL/min (ref >60)
GFR calc non Af Amer: >60 mL/min (ref >60)
Glucose, Bld: 102 mg/dL — ABNORMAL HIGH (ref 70–99)
Potassium: 3.7 mmol/L (ref 3.5–5.1)
Sodium: 139 mmol/L (ref 135–145)

## 2019-05-25 LAB — CREATININE, SERUM
Creatinine, Ser: 1.03 mg/dL (ref 0.61–1.24)
GFR calc Af Amer: >60 mL/min (ref >60)
GFR calc non Af Amer: >60 mL/min (ref >60)

## 2019-05-25 LAB — PLATELET COUNT: Platelets: 229 10*3/uL (ref 150–400)

## 2019-05-25 LAB — PROTIME-INR
INR: 1.4 — ABNORMAL HIGH (ref 0.8–1.2)
Prothrombin Time: 17.4 seconds — ABNORMAL HIGH (ref 11.4–15.2)

## 2019-05-25 LAB — ECHO INTRAOPERATIVE TEE
Height: 71 in
Weight: 3942.4 oz

## 2019-05-25 LAB — HEMOGLOBIN AND HEMATOCRIT, BLOOD
HCT: 29.2 % — ABNORMAL LOW (ref 39.0–52.0)
Hemoglobin: 9.8 g/dL — ABNORMAL LOW (ref 13.0–17.0)

## 2019-05-25 LAB — MAGNESIUM: Magnesium: 3.1 mg/dL — ABNORMAL HIGH (ref 1.7–2.4)

## 2019-05-25 SURGERY — CORONARY ARTERY BYPASS GRAFTING (CABG)
Anesthesia: General | Site: Esophagus

## 2019-05-25 MED ORDER — MAGNESIUM SULFATE 4 GM/100ML IV SOLN
4.0000 g | Freq: Once | INTRAVENOUS | Status: AC
  Administered 2019-05-25: 4 g via INTRAVENOUS
  Filled 2019-05-25: qty 100

## 2019-05-25 MED ORDER — LACTATED RINGERS IV SOLN
INTRAVENOUS | Status: DC | PRN
  Administered 2019-05-25: 07:00:00 via INTRAVENOUS

## 2019-05-25 MED ORDER — CHLORHEXIDINE GLUCONATE 0.12% ORAL RINSE (MEDLINE KIT)
15.0000 mL | Freq: Two times a day (BID) | OROMUCOSAL | Status: DC
  Administered 2019-05-25: 15 mL via OROMUCOSAL

## 2019-05-25 MED ORDER — HEPARIN SODIUM (PORCINE) 1000 UNIT/ML IJ SOLN
INTRAMUSCULAR | Status: DC | PRN
  Administered 2019-05-25: 3000 [IU] via INTRAVENOUS
  Administered 2019-05-25: 37000 [IU] via INTRAVENOUS

## 2019-05-25 MED ORDER — NITROGLYCERIN IN D5W 200-5 MCG/ML-% IV SOLN: 0.0000 ug/min | INTRAVENOUS | Status: DC

## 2019-05-25 MED ORDER — SODIUM CHLORIDE (PF) 0.9 % IJ SOLN
INTRAMUSCULAR | Status: AC
  Filled 2019-05-25: qty 20

## 2019-05-25 MED ORDER — HEMOSTATIC AGENTS (NO CHARGE) OPTIME
TOPICAL | Status: DC | PRN
  Administered 2019-05-25: 1 via TOPICAL

## 2019-05-25 MED ORDER — METOCLOPRAMIDE HCL 5 MG/ML IJ SOLN
10.0000 mg | Freq: Four times a day (QID) | INTRAMUSCULAR | Status: AC
  Administered 2019-05-25 – 2019-05-26 (×4): 10 mg via INTRAVENOUS
  Filled 2019-05-25 (×3): qty 2

## 2019-05-25 MED ORDER — PHENYLEPHRINE HCL-NACL 20-0.9 MG/250ML-% IV SOLN
0.0000 ug/min | INTRAVENOUS | Status: DC
  Administered 2019-05-25: 45 ug/min via INTRAVENOUS
  Filled 2019-05-25: qty 250

## 2019-05-25 MED ORDER — PHENYLEPHRINE 40 MCG/ML (10ML) SYRINGE FOR IV PUSH (FOR BLOOD PRESSURE SUPPORT)
PREFILLED_SYRINGE | INTRAVENOUS | Status: AC
  Filled 2019-05-25: qty 10

## 2019-05-25 MED ORDER — ARTIFICIAL TEARS OPHTHALMIC OINT
TOPICAL_OINTMENT | OPHTHALMIC | Status: DC | PRN
  Administered 2019-05-25: 1 via OPHTHALMIC

## 2019-05-25 MED ORDER — ALBUMIN HUMAN 5 % IV SOLN
250.0000 mL | INTRAVENOUS | Status: AC | PRN
  Administered 2019-05-25 (×2): 12.5 g via INTRAVENOUS

## 2019-05-25 MED ORDER — 0.9 % SODIUM CHLORIDE (POUR BTL) OPTIME
TOPICAL | Status: DC | PRN
  Administered 2019-05-25: 10:00:00 5000 mL

## 2019-05-25 MED ORDER — HEPARIN SODIUM (PORCINE) 1000 UNIT/ML IJ SOLN
INTRAMUSCULAR | Status: AC
  Filled 2019-05-25: qty 1

## 2019-05-25 MED ORDER — ASPIRIN 81 MG PO CHEW
324.0000 mg | CHEWABLE_TABLET | Freq: Every day | ORAL | Status: DC
  Filled 2019-05-25: qty 4

## 2019-05-25 MED ORDER — PHENYLEPHRINE HCL (PRESSORS) 10 MG/ML IV SOLN
INTRAVENOUS | Status: DC | PRN
  Administered 2019-05-25 (×2): 40 ug via INTRAVENOUS

## 2019-05-25 MED ORDER — ACETAMINOPHEN 500 MG PO TABS
1000.0000 mg | ORAL_TABLET | Freq: Four times a day (QID) | ORAL | Status: AC
  Administered 2019-05-26 – 2019-05-30 (×18): 1000 mg via ORAL
  Filled 2019-05-25 (×18): qty 2

## 2019-05-25 MED ORDER — ROCURONIUM BROMIDE 10 MG/ML (PF) SYRINGE
PREFILLED_SYRINGE | INTRAVENOUS | Status: AC
  Filled 2019-05-25: qty 10

## 2019-05-25 MED ORDER — ONDANSETRON HCL 4 MG/2ML IJ SOLN
INTRAMUSCULAR | Status: DC | PRN
  Administered 2019-05-25: 4 mg via INTRAVENOUS

## 2019-05-25 MED ORDER — FENTANYL CITRATE (PF) 250 MCG/5ML IJ SOLN
INTRAMUSCULAR | Status: DC | PRN
  Administered 2019-05-25: 250 ug via INTRAVENOUS
  Administered 2019-05-25: 50 ug via INTRAVENOUS
  Administered 2019-05-25 (×2): 150 ug via INTRAVENOUS
  Administered 2019-05-25: 250 ug via INTRAVENOUS
  Administered 2019-05-25: 100 ug via INTRAVENOUS
  Administered 2019-05-25: 150 ug via INTRAVENOUS
  Administered 2019-05-25: 50 ug via INTRAVENOUS
  Administered 2019-05-25: 100 ug via INTRAVENOUS

## 2019-05-25 MED ORDER — OXYCODONE HCL 5 MG PO TABS
5.0000 mg | ORAL_TABLET | ORAL | Status: DC | PRN
  Administered 2019-05-26 (×2): 10 mg via ORAL
  Administered 2019-05-26: 5 mg via ORAL
  Administered 2019-05-28 (×2): 10 mg via ORAL
  Filled 2019-05-25 (×2): qty 2
  Filled 2019-05-25: qty 1
  Filled 2019-05-25 (×3): qty 2

## 2019-05-25 MED ORDER — ACETAMINOPHEN 650 MG RE SUPP
650.0000 mg | Freq: Once | RECTAL | Status: AC
  Administered 2019-05-25: 15:00:00 650 mg via RECTAL

## 2019-05-25 MED ORDER — SUCCINYLCHOLINE CHLORIDE 200 MG/10ML IV SOSY
PREFILLED_SYRINGE | INTRAVENOUS | Status: AC
  Filled 2019-05-25: qty 10

## 2019-05-25 MED ORDER — MONTELUKAST SODIUM 10 MG PO TABS
10.0000 mg | ORAL_TABLET | Freq: Every day | ORAL | Status: DC
  Administered 2019-05-26 – 2019-05-31 (×6): 10 mg via ORAL
  Filled 2019-05-25 (×6): qty 1

## 2019-05-25 MED ORDER — MILRINONE LACTATE IN DEXTROSE 20-5 MG/100ML-% IV SOLN
0.1250 ug/kg/min | INTRAVENOUS | Status: DC
  Administered 2019-05-26 – 2019-05-27 (×2): 0.125 ug/kg/min via INTRAVENOUS
  Filled 2019-05-25 (×2): qty 100

## 2019-05-25 MED ORDER — CALCIUM CHLORIDE 10 % IV SOLN
INTRAVENOUS | Status: DC | PRN
  Administered 2019-05-25 (×4): .2 mg via INTRAVENOUS

## 2019-05-25 MED ORDER — SODIUM CHLORIDE 0.9% FLUSH: 10.0000 mL | INTRAVENOUS | Status: DC | PRN

## 2019-05-25 MED ORDER — PROTAMINE SULFATE 10 MG/ML IV SOLN
INTRAVENOUS | Status: DC | PRN
  Administered 2019-05-25 (×4): 50 mg via INTRAVENOUS
  Administered 2019-05-25: 20 mg via INTRAVENOUS
  Administered 2019-05-25: 30 mg via INTRAVENOUS
  Administered 2019-05-25 (×3): 50 mg via INTRAVENOUS

## 2019-05-25 MED ORDER — ACETAMINOPHEN 160 MG/5ML PO SOLN: 1000.0000 mg | Freq: Four times a day (QID) | ORAL | Status: AC

## 2019-05-25 MED ORDER — LACTATED RINGERS IV SOLN: INTRAVENOUS | Status: DC

## 2019-05-25 MED ORDER — TRANEXAMIC ACID 1000 MG/10ML IV SOLN
1.5000 mg/kg/h | INTRAVENOUS | Status: DC
  Filled 2019-05-25: qty 25

## 2019-05-25 MED ORDER — MORPHINE SULFATE (PF) 2 MG/ML IV SOLN
1.0000 mg | INTRAVENOUS | Status: DC | PRN
  Administered 2019-05-25: 23:00:00 1 mg via INTRAVENOUS
  Administered 2019-05-26 (×2): 2 mg via INTRAVENOUS
  Filled 2019-05-25 (×3): qty 1

## 2019-05-25 MED ORDER — VANCOMYCIN HCL IN DEXTROSE 1-5 GM/200ML-% IV SOLN
1000.0000 mg | Freq: Once | INTRAVENOUS | Status: AC
  Administered 2019-05-25: 20:00:00 1000 mg via INTRAVENOUS
  Filled 2019-05-25: qty 200

## 2019-05-25 MED ORDER — SODIUM CHLORIDE 0.9 % IV SOLN
20.0000 ug | INTRAVENOUS | Status: AC
  Administered 2019-05-25: 20 ug via INTRAVENOUS
  Filled 2019-05-25: qty 5

## 2019-05-25 MED ORDER — METOPROLOL TARTRATE 5 MG/5ML IV SOLN
2.5000 mg | INTRAVENOUS | Status: DC | PRN
  Administered 2019-05-28: 03:00:00 5 mg via INTRAVENOUS
  Filled 2019-05-25 (×2): qty 5

## 2019-05-25 MED ORDER — ONDANSETRON HCL 4 MG/2ML IJ SOLN
INTRAMUSCULAR | Status: AC
  Filled 2019-05-25: qty 2

## 2019-05-25 MED ORDER — BISACODYL 10 MG RE SUPP
10.0000 mg | Freq: Every day | RECTAL | Status: DC
  Filled 2019-05-25: qty 1

## 2019-05-25 MED ORDER — ORAL CARE MOUTH RINSE
15.0000 mL | Freq: Two times a day (BID) | OROMUCOSAL | Status: DC
  Administered 2019-05-25 – 2019-06-01 (×10): 15 mL via OROMUCOSAL

## 2019-05-25 MED ORDER — ONDANSETRON HCL 4 MG/2ML IJ SOLN
4.0000 mg | Freq: Four times a day (QID) | INTRAMUSCULAR | Status: DC | PRN
  Administered 2019-05-26 – 2019-05-30 (×6): 4 mg via INTRAVENOUS
  Filled 2019-05-25 (×6): qty 2

## 2019-05-25 MED ORDER — SUCCINYLCHOLINE CHLORIDE 20 MG/ML IJ SOLN
INTRAMUSCULAR | Status: DC | PRN
  Administered 2019-05-25: 140 mg via INTRAVENOUS

## 2019-05-25 MED ORDER — LACTATED RINGERS IV SOLN: 500.0000 mL | Freq: Once | INTRAVENOUS | Status: DC | PRN

## 2019-05-25 MED ORDER — DEXMEDETOMIDINE HCL IN NACL 200 MCG/50ML IV SOLN
INTRAVENOUS | Status: AC
  Administered 2019-05-25: 15:00:00 0.06 ug/kg/h via INTRAVENOUS
  Filled 2019-05-25: qty 50

## 2019-05-25 MED ORDER — DOCUSATE SODIUM 100 MG PO CAPS
200.0000 mg | ORAL_CAPSULE | Freq: Every day | ORAL | Status: DC
  Administered 2019-05-26 – 2019-05-30 (×4): 200 mg via ORAL
  Filled 2019-05-25 (×5): qty 2

## 2019-05-25 MED ORDER — LORATADINE 10 MG PO TABS
10.0000 mg | ORAL_TABLET | Freq: Every day | ORAL | Status: DC
  Administered 2019-05-26 – 2019-06-01 (×7): 10 mg via ORAL
  Filled 2019-05-25 (×7): qty 1

## 2019-05-25 MED ORDER — MIDAZOLAM HCL 2 MG/2ML IJ SOLN
INTRAMUSCULAR | Status: AC
  Filled 2019-05-25: qty 2

## 2019-05-25 MED ORDER — ROCURONIUM BROMIDE 10 MG/ML (PF) SYRINGE
PREFILLED_SYRINGE | INTRAVENOUS | Status: DC | PRN
  Administered 2019-05-25: 100 mg via INTRAVENOUS

## 2019-05-25 MED ORDER — SODIUM CHLORIDE 0.45 % IV SOLN
INTRAVENOUS | Status: DC | PRN
  Administered 2019-05-25: 15:00:00 via INTRAVENOUS

## 2019-05-25 MED ORDER — MIDAZOLAM HCL 2 MG/2ML IJ SOLN: 2.0000 mg | INTRAMUSCULAR | Status: DC | PRN

## 2019-05-25 MED ORDER — PANTOPRAZOLE SODIUM 40 MG PO TBEC
40.0000 mg | DELAYED_RELEASE_TABLET | Freq: Every day | ORAL | Status: DC
  Administered 2019-05-27 – 2019-06-01 (×6): 40 mg via ORAL
  Filled 2019-05-25 (×6): qty 1

## 2019-05-25 MED ORDER — MIDAZOLAM HCL (PF) 10 MG/2ML IJ SOLN
INTRAMUSCULAR | Status: AC
  Filled 2019-05-25: qty 2

## 2019-05-25 MED ORDER — TRAMADOL HCL 50 MG PO TABS
50.0000 mg | ORAL_TABLET | ORAL | Status: DC | PRN
  Administered 2019-05-27 – 2019-05-31 (×3): 100 mg via ORAL
  Filled 2019-05-25: qty 1
  Filled 2019-05-25 (×3): qty 2

## 2019-05-25 MED ORDER — ACETAMINOPHEN 160 MG/5ML PO SOLN: 650.0000 mg | Freq: Once | ORAL | Status: AC

## 2019-05-25 MED ORDER — DEXMEDETOMIDINE HCL IN NACL 200 MCG/50ML IV SOLN
0.0000 ug/kg/h | INTRAVENOUS | Status: DC
  Administered 2019-05-25: 0.06 ug/kg/h via INTRAVENOUS

## 2019-05-25 MED ORDER — ORAL CARE MOUTH RINSE
15.0000 mL | OROMUCOSAL | Status: DC
  Administered 2019-05-25: 15 mL via OROMUCOSAL

## 2019-05-25 MED ORDER — METOPROLOL TARTRATE 12.5 MG HALF TABLET
12.5000 mg | ORAL_TABLET | Freq: Two times a day (BID) | ORAL | Status: DC
  Administered 2019-05-26: 12.5 mg via ORAL
  Filled 2019-05-25: qty 1

## 2019-05-25 MED ORDER — SODIUM CHLORIDE 0.9 % IV SOLN: 250.0000 mL | INTRAVENOUS | Status: DC

## 2019-05-25 MED ORDER — FENTANYL CITRATE (PF) 250 MCG/5ML IJ SOLN
INTRAMUSCULAR | Status: AC
  Filled 2019-05-25: qty 25

## 2019-05-25 MED ORDER — FAMOTIDINE IN NACL 20-0.9 MG/50ML-% IV SOLN
20.0000 mg | Freq: Two times a day (BID) | INTRAVENOUS | Status: DC
  Administered 2019-05-25: 20 mg via INTRAVENOUS

## 2019-05-25 MED ORDER — SODIUM CHLORIDE (PF) 0.9 % IJ SOLN
INTRAMUSCULAR | Status: AC
  Filled 2019-05-25: qty 10

## 2019-05-25 MED ORDER — PROPOFOL 10 MG/ML IV BOLUS
INTRAVENOUS | Status: DC | PRN
  Administered 2019-05-25: 80 mg via INTRAVENOUS

## 2019-05-25 MED ORDER — ALBUMIN HUMAN 5 % IV SOLN
INTRAVENOUS | Status: DC | PRN
  Administered 2019-05-25 (×3): via INTRAVENOUS

## 2019-05-25 MED ORDER — BISACODYL 5 MG PO TBEC
10.0000 mg | DELAYED_RELEASE_TABLET | Freq: Every day | ORAL | Status: DC
  Administered 2019-05-26 – 2019-05-30 (×3): 10 mg via ORAL
  Filled 2019-05-25 (×5): qty 2

## 2019-05-25 MED ORDER — SODIUM CHLORIDE 0.9% FLUSH
10.0000 mL | Freq: Two times a day (BID) | INTRAVENOUS | Status: DC
  Administered 2019-05-25 – 2019-05-27 (×4): 10 mL
  Administered 2019-05-27: 20 mL
  Administered 2019-05-28 – 2019-05-31 (×6): 10 mL

## 2019-05-25 MED ORDER — LACTATED RINGERS IV SOLN
INTRAVENOUS | Status: DC | PRN
  Administered 2019-05-25 (×2): via INTRAVENOUS

## 2019-05-25 MED ORDER — CHLORHEXIDINE GLUCONATE 0.12 % MT SOLN
15.0000 mL | OROMUCOSAL | Status: AC
  Administered 2019-05-25: 15:00:00 15 mL via OROMUCOSAL

## 2019-05-25 MED ORDER — HEPARIN SODIUM (PORCINE) 1000 UNIT/ML IJ SOLN
INTRAMUSCULAR | Status: AC
  Filled 2019-05-25: qty 2

## 2019-05-25 MED ORDER — INSULIN REGULAR(HUMAN) IN NACL 100-0.9 UT/100ML-% IV SOLN: INTRAVENOUS | Status: DC

## 2019-05-25 MED ORDER — SODIUM CHLORIDE 0.9 % IV SOLN: INTRAVENOUS | Status: DC

## 2019-05-25 MED ORDER — MIDAZOLAM HCL 5 MG/5ML IJ SOLN
INTRAMUSCULAR | Status: DC | PRN
  Administered 2019-05-25: 1 mg via INTRAVENOUS
  Administered 2019-05-25 (×2): 2 mg via INTRAVENOUS
  Administered 2019-05-25 (×2): 1 mg via INTRAVENOUS
  Administered 2019-05-25: 3 mg via INTRAVENOUS
  Administered 2019-05-25: 2 mg via INTRAVENOUS

## 2019-05-25 MED ORDER — POTASSIUM CHLORIDE 10 MEQ/50ML IV SOLN: 10.0000 meq | INTRAVENOUS | Status: AC

## 2019-05-25 MED ORDER — ASPIRIN EC 325 MG PO TBEC
325.0000 mg | DELAYED_RELEASE_TABLET | Freq: Every day | ORAL | Status: DC
  Administered 2019-05-26 – 2019-06-01 (×7): 325 mg via ORAL
  Filled 2019-05-25 (×7): qty 1

## 2019-05-25 MED ORDER — SODIUM CHLORIDE 0.9% FLUSH: 3.0000 mL | INTRAVENOUS | Status: DC | PRN

## 2019-05-25 MED ORDER — SODIUM CHLORIDE 0.9% FLUSH: 3.0000 mL | Freq: Two times a day (BID) | INTRAVENOUS | Status: DC

## 2019-05-25 MED ORDER — CHLORHEXIDINE GLUCONATE CLOTH 2 % EX PADS
6.0000 | MEDICATED_PAD | Freq: Every day | CUTANEOUS | Status: DC
  Administered 2019-05-25 – 2019-05-28 (×2): 6 via TOPICAL

## 2019-05-25 MED ORDER — METOPROLOL TARTRATE 25 MG/10 ML ORAL SUSPENSION: 12.5000 mg | Freq: Two times a day (BID) | ORAL | Status: DC

## 2019-05-25 MED ORDER — VECURONIUM BROMIDE 10 MG IV SOLR
INTRAVENOUS | Status: DC | PRN
  Administered 2019-05-25 (×4): 5 mg via INTRAVENOUS

## 2019-05-25 MED ORDER — CHLORHEXIDINE GLUCONATE CLOTH 2 % EX PADS
6.0000 | MEDICATED_PAD | Freq: Every day | CUTANEOUS | Status: DC
  Administered 2019-05-25 – 2019-05-27 (×3): 6 via TOPICAL

## 2019-05-25 MED ORDER — HEMOSTATIC AGENTS (NO CHARGE) OPTIME
TOPICAL | Status: DC | PRN
  Administered 2019-05-25: 3 via TOPICAL

## 2019-05-25 MED ORDER — INSULIN REGULAR BOLUS VIA INFUSION
0.0000 [IU] | Freq: Three times a day (TID) | INTRAVENOUS | Status: DC
  Filled 2019-05-25: qty 10

## 2019-05-25 MED ORDER — HEMOSTATIC AGENTS (NO CHARGE) OPTIME
TOPICAL | Status: DC | PRN
  Administered 2019-05-25: 2 via TOPICAL

## 2019-05-25 MED ORDER — SODIUM CHLORIDE 0.9 % IV SOLN
1.5000 g | Freq: Two times a day (BID) | INTRAVENOUS | Status: AC
  Administered 2019-05-25 – 2019-05-27 (×4): 1.5 g via INTRAVENOUS
  Filled 2019-05-25 (×4): qty 1.5

## 2019-05-25 MED FILL — Verapamil HCl IV Soln 2.5 MG/ML: INTRAVENOUS | Qty: 2 | Status: AC

## 2019-05-25 MED FILL — Heparin Sodium (Porcine) Inj 1000 Unit/ML: INTRAMUSCULAR | Qty: 10 | Status: AC

## 2019-05-25 SURGICAL SUPPLY — 99 items
ADAPTER CARDIO PERF ANTE/RETRO (ADAPTER) ×4 IMPLANT
BAG DECANTER FOR FLEXI CONT (MISCELLANEOUS) ×4 IMPLANT
BANDAGE ACE 4X5 VEL STRL LF (GAUZE/BANDAGES/DRESSINGS) ×3 IMPLANT
BANDAGE ACE 6X5 VEL STRL LF (GAUZE/BANDAGES/DRESSINGS) ×3 IMPLANT
BANDAGE ELASTIC 4 VELCRO ST LF (GAUZE/BANDAGES/DRESSINGS) ×4 IMPLANT
BANDAGE ELASTIC 6 VELCRO ST LF (GAUZE/BANDAGES/DRESSINGS) ×4 IMPLANT
BASKET HEART  (ORDER IN 25'S) (MISCELLANEOUS) ×1
BASKET HEART (ORDER IN 25'S) (MISCELLANEOUS) ×1
BASKET HEART (ORDER IN 25S) (MISCELLANEOUS) ×2 IMPLANT
BLADE CLIPPER SURG (BLADE) ×4 IMPLANT
BLADE STERNUM SYSTEM 6 (BLADE) ×4 IMPLANT
BLADE SURG 11 STRL SS (BLADE) ×4 IMPLANT
BLADE SURG 12 STRL SS (BLADE) ×4 IMPLANT
BNDG GAUZE ELAST 4 BULKY (GAUZE/BANDAGES/DRESSINGS) ×4 IMPLANT
CANISTER SUCT 3000ML PPV (MISCELLANEOUS) ×4 IMPLANT
CANNULA ARTERIAL NVNT 3/8 22FR (MISCELLANEOUS) ×4 IMPLANT
CANNULA GUNDRY RCSP 15FR (MISCELLANEOUS) ×4 IMPLANT
CATH CPB KIT VANTRIGT (MISCELLANEOUS) ×4 IMPLANT
CATH ROBINSON RED A/P 18FR (CATHETERS) ×12 IMPLANT
CATH THORACIC 36FR RT ANG (CATHETERS) ×4 IMPLANT
CLIP FOGARTY SPRING 6M (CLIP) ×4 IMPLANT
CLIP VESOCCLUDE SM WIDE 24/CT (CLIP) ×4 IMPLANT
COVER WAND RF STERILE (DRAPES) ×4 IMPLANT
DEFOGGER ANTIFOG KIT (MISCELLANEOUS) ×4 IMPLANT
DERMABOND ADVANCED (GAUZE/BANDAGES/DRESSINGS) ×2
DERMABOND ADVANCED .7 DNX12 (GAUZE/BANDAGES/DRESSINGS) ×2 IMPLANT
DRAIN CHANNEL 32F RND 10.7 FF (WOUND CARE) ×4 IMPLANT
DRAPE CARDIOVASCULAR INCISE (DRAPES) ×2
DRAPE SLUSH/WARMER DISC (DRAPES) ×4 IMPLANT
DRAPE SRG 135X102X78XABS (DRAPES) ×2 IMPLANT
DRSG AQUACEL AG ADV 3.5X14 (GAUZE/BANDAGES/DRESSINGS) ×4 IMPLANT
ELECT BLADE 4.0 EZ CLEAN MEGAD (MISCELLANEOUS) ×4
ELECT BLADE 6.5 EXT (BLADE) ×4 IMPLANT
ELECT CAUTERY BLADE 6.4 (BLADE) ×4 IMPLANT
ELECT REM PT RETURN 9FT ADLT (ELECTROSURGICAL) ×8
ELECTRODE BLDE 4.0 EZ CLN MEGD (MISCELLANEOUS) ×2 IMPLANT
ELECTRODE REM PT RTRN 9FT ADLT (ELECTROSURGICAL) ×4 IMPLANT
FELT TEFLON 1X6 (MISCELLANEOUS) ×8 IMPLANT
GAUZE SPONGE 4X4 12PLY STRL (GAUZE/BANDAGES/DRESSINGS) ×8 IMPLANT
GAUZE SPONGE 4X4 12PLY STRL LF (GAUZE/BANDAGES/DRESSINGS) ×8 IMPLANT
GLOVE BIO SURGEON STRL SZ7.5 (GLOVE) ×12 IMPLANT
GOWN STRL REUS W/ TWL LRG LVL3 (GOWN DISPOSABLE) ×8 IMPLANT
GOWN STRL REUS W/TWL LRG LVL3 (GOWN DISPOSABLE) ×8
HEMOSTAT POWDER SURGIFOAM 1G (HEMOSTASIS) ×12 IMPLANT
HEMOSTAT SURGICEL 2X14 (HEMOSTASIS) ×4 IMPLANT
INSERT FOGARTY XLG (MISCELLANEOUS) IMPLANT
KIT BASIN OR (CUSTOM PROCEDURE TRAY) ×4 IMPLANT
KIT SUCTION CATH 14FR (SUCTIONS) ×4 IMPLANT
KIT TURNOVER KIT B (KITS) ×4 IMPLANT
KIT VASOVIEW HEMOPRO 2 VH 4000 (KITS) ×4 IMPLANT
LEAD PACING MYOCARDI (MISCELLANEOUS) ×4 IMPLANT
MARKER GRAFT CORONARY BYPASS (MISCELLANEOUS) ×12 IMPLANT
NS IRRIG 1000ML POUR BTL (IV SOLUTION) ×20 IMPLANT
PACK E OPEN HEART (SUTURE) ×4 IMPLANT
PACK OPEN HEART (CUSTOM PROCEDURE TRAY) ×4 IMPLANT
PAD ARMBOARD 7.5X6 YLW CONV (MISCELLANEOUS) ×8 IMPLANT
PAD ELECT DEFIB RADIOL ZOLL (MISCELLANEOUS) ×4 IMPLANT
PENCIL BUTTON HOLSTER BLD 10FT (ELECTRODE) ×4 IMPLANT
POSITIONER HEAD DONUT 9IN (MISCELLANEOUS) ×4 IMPLANT
PUNCH AORTIC ROTATE 4.0MM (MISCELLANEOUS) IMPLANT
PUNCH AORTIC ROTATE 4.5MM 8IN (MISCELLANEOUS) IMPLANT
PUNCH AORTIC ROTATE 5MM 8IN (MISCELLANEOUS) IMPLANT
SET CARDIOPLEGIA MPS 5001102 (MISCELLANEOUS) ×4 IMPLANT
SPONGE LAP 18X18 RF (DISPOSABLE) ×8 IMPLANT
SPONGE LAP 4X18 RFD (DISPOSABLE) ×4 IMPLANT
SURGIFLO W/THROMBIN 8M KIT (HEMOSTASIS) ×4 IMPLANT
SUT BONE WAX W31G (SUTURE) ×4 IMPLANT
SUT MNCRL AB 4-0 PS2 18 (SUTURE) ×4 IMPLANT
SUT PROLENE 3 0 SH DA (SUTURE) IMPLANT
SUT PROLENE 3 0 SH1 36 (SUTURE) IMPLANT
SUT PROLENE 4 0 RB 1 (SUTURE) ×8
SUT PROLENE 4 0 SH DA (SUTURE) ×8 IMPLANT
SUT PROLENE 4-0 RB1 .5 CRCL 36 (SUTURE) ×8 IMPLANT
SUT PROLENE 5 0 C 1 36 (SUTURE) ×8 IMPLANT
SUT PROLENE 6 0 C 1 30 (SUTURE) ×16 IMPLANT
SUT PROLENE 6 0 CC (SUTURE) ×12 IMPLANT
SUT PROLENE 8 0 BV175 6 (SUTURE) ×12 IMPLANT
SUT PROLENE BLUE 7 0 (SUTURE) ×12 IMPLANT
SUT SILK  1 MH (SUTURE)
SUT SILK 1 MH (SUTURE) IMPLANT
SUT SILK 2 0 SH CR/8 (SUTURE) ×4 IMPLANT
SUT SILK 3 0 SH CR/8 (SUTURE) ×4 IMPLANT
SUT STEEL 6MS V (SUTURE) ×4 IMPLANT
SUT STEEL SZ 6 DBL 3X14 BALL (SUTURE) ×4 IMPLANT
SUT VIC AB 1 CTX 36 (SUTURE) ×6
SUT VIC AB 1 CTX36XBRD ANBCTR (SUTURE) ×6 IMPLANT
SUT VIC AB 2-0 CT1 27 (SUTURE) ×2
SUT VIC AB 2-0 CT1 TAPERPNT 27 (SUTURE) ×2 IMPLANT
SUT VIC AB 2-0 CTX 27 (SUTURE) IMPLANT
SUT VIC AB 3-0 X1 27 (SUTURE) IMPLANT
SYR 20ML ECCENTRIC (SYRINGE) ×4 IMPLANT
SYSTEM SAHARA CHEST DRAIN ATS (WOUND CARE) ×4 IMPLANT
TAPE CLOTH SURG 4X10 WHT LF (GAUZE/BANDAGES/DRESSINGS) ×4 IMPLANT
TOWEL GREEN STERILE (TOWEL DISPOSABLE) ×4 IMPLANT
TOWEL GREEN STERILE FF (TOWEL DISPOSABLE) ×4 IMPLANT
TRAY FOLEY SLVR 16FR TEMP STAT (SET/KITS/TRAYS/PACK) ×4 IMPLANT
TUBING LAP HI FLOW INSUFFLATIO (TUBING) ×4 IMPLANT
UNDERPAD 30X30 (UNDERPADS AND DIAPERS) ×4 IMPLANT
WATER STERILE IRR 1000ML POUR (IV SOLUTION) ×8 IMPLANT

## 2019-05-25 NOTE — Progress Notes (Signed)
RT assessed pt readiness to heart wean next step. Pt able to hold head off of bed for 20+ sec, give bilat thumbs up, wiggle toes, and stick out tongue as instructed, Pt placed on CPAP/PSV 10/5 and 40% FIO2. RN will obtain ABG 2045 for assessment of next step of extubation per protocol. RT will obtain VC and NIF to assess pt for readiness to extubate. RT will continue monitor.

## 2019-05-25 NOTE — Brief Op Note (Signed)
05/22/2019 - 05/25/2019  7:40 AM  PATIENT:  Tony Holloway  73 y.o. male  PRE-OPERATIVE DIAGNOSIS:  CAD  POST-OPERATIVE DIAGNOSIS:  CAD  PROCEDURE:  Procedure(s):  CORONARY ARTERY BYPASS GRAFTING x 4 -LIMA to LAD -SVG to RAMUS INTERMEDIATE -SVG to PLVB -SVG to PDA  ENDOSCOPIC HARVEST GREATER SAPHENOUS VEIN -Right Leg  TRANSESOPHAGEAL ECHOCARDIOGRAM (TEE) (N/A)  SURGEON:  Surgeon(s) and Role:    Ivin Poot, MD - Primary  PHYSICIAN ASSISTANT: Ellwood Handler PA-C  ANESTHESIA:   general  EBL:  1050 mL   BLOOD ADMINISTERED: CELLSAVER  DRAINS: Left Pleural Chest Tube, Mediastinal Chest Drains   LOCAL MEDICATIONS USED:  NONE  SPECIMEN:  No Specimen  DISPOSITION OF SPECIMEN:  N/A  COUNTS:  YES  TOURNIQUET:  * No tourniquets in log *  DICTATION: .Dragon Dictation  PLAN OF CARE: Admit to inpatient   PATIENT DISPOSITION:  ICU - intubated and hemodynamically stable.   Delay start of Pharmacological VTE agent (>24hrs) due to surgical blood loss or risk of bleeding: yes

## 2019-05-25 NOTE — Procedures (Signed)
Extubation Procedure Note  Patient Details:   Name: Tony Holloway DOB: 04-09-1946 MRN: [id number]   Airway Documentation:    Vent end date: (not recorded) Vent end time: (not recorded)   Evaluation  O2 sats: stable throughout Complications: No apparent complications Patient did tolerate procedure well. Bilateral Breath Sounds: Clear, Diminished   Yes pt able to speak name, pt positive for cuff leak, no stridor noted at this time, pt able to cough. NIF -30 and VC .9LPM. RT will continue to monitor.    Roby Lofts Saryah Loper 05/25/2019, 9:10 PM

## 2019-05-25 NOTE — Progress Notes (Signed)
EVENING ROUNDS NOTE :     [address]       [place] [address]             [phone]                 Day of Surgery Procedure(s) (LRB): CORONARY ARTERY BYPASS GRAFTING (CABG) times four using left internal mammary artery and right leg saphenous vein (N/A) TRANSESOPHAGEAL ECHOCARDIOGRAM (TEE) (N/A)  Total Length of Stay:  LOS: 3 days  BP 107/73   Pulse 88   Temp 97.7 F (36.5 C)   Resp 12   Ht 5\' 11"  (1.803 m)   Wt 111.8 kg   SpO2 100%   BMI 34.37 kg/m   .Intake/Output      06/28 0701 - 06/29 0700 06/29 0701 - 06/30 0700   P.O. 1708    I.V. (mL/kg)  2650.8 (23.7)   Blood  500   IV Piggyback  1442.4   Total Intake(mL/kg) 1708 (15.3) 4593.1 (41.1)   Urine (mL/kg/hr) 2850 (1.1) 1675 (1.4)   Stool 0    Blood  1050   Chest Tube  100   Total Output 2850 2825   Net -1142 +1768.1        Urine Occurrence 1 x    Stool Occurrence 1 x      . sodium chloride 20 mL/hr at 05/25/19 1600  . [START ON 05/26/2019] sodium chloride    . sodium chloride 20 mL/hr at 05/25/19 1520  . albumin human 12.5 g (05/25/19 1500)  . cefUROXime (ZINACEF)  IV 1.5 g (05/25/19 1610)  . dexmedetomidine (PRECEDEX) IV infusion 0.06 mcg/kg/hr (05/25/19 1450)  . famotidine (PEPCID) IV Stopped (05/25/19 1531)  . insulin 2 Units/hr (05/25/19 1450)  . lactated ringers    . lactated ringers    . lactated ringers 20 mL/hr at 05/25/19 1600  . magnesium sulfate 20 mL/hr at 05/25/19 1600  . milrinone 0.125 mcg/kg/min (05/25/19 1600)  . nitroGLYCERIN 0 mcg/min (05/25/19 1450)  . phenylephrine (NEO-SYNEPHRINE) Adult infusion 20 mcg/min (05/25/19 1600)  . potassium chloride    . vancomycin       Lab Results  Component Value Date   WBC 18.3 (H) 05/25/2019   HGB 10.2 (L) 05/25/2019   HCT 31.1 (L) 05/25/2019   PLT 194 05/25/2019   GLUCOSE 122 (H) 05/25/2019   CHOL 114 01/08/2019   TRIG 119 01/08/2019   HDL 31 (L) 01/08/2019   LDLCALC 59 01/08/2019   ALT 20 05/23/2019   AST 27  05/23/2019   NA 139 05/25/2019   K 4.0 05/25/2019   CL 105 05/25/2019   CREATININE 1.07 05/25/2019   BUN 12 05/25/2019   CO2 26 05/25/2019   TSH 3.174 05/23/2019   INR 1.4 (H) 05/25/2019   HGBA1C 5.7 (H) 05/23/2019   Early postop Not bleeding CI 1.9 , milrinione and neo\ Angio edema during case, holding on weaning vent for now      Grace Isaac MD  Beeper [phone] Office [phone] 05/25/2019 5:40 PM

## 2019-05-25 NOTE — Transfer of Care (Signed)
Immediate Anesthesia Transfer of Care Note  Patient: Tony Holloway  Procedure(s) Performed: CORONARY ARTERY BYPASS GRAFTING (CABG) times four using left internal mammary artery and right leg saphenous vein (N/A Chest) TRANSESOPHAGEAL ECHOCARDIOGRAM (TEE) (N/A Esophagus)  Patient Location: SICU  Anesthesia Type:General  Level of Consciousness: Patient remains intubated per anesthesia plan  Airway & Oxygen Therapy: Patient remains intubated per anesthesia plan and Patient placed on Ventilator (see vital sign flow sheet for setting)  Post-op Assessment: Report given to RN and Post -op Vital signs reviewed and stable  Post vital signs: Reviewed and stable  Last Vitals:  Vitals Value Taken Time  BP    Temp    Pulse    Resp    SpO2      Last Pain:  Vitals:   05/25/19 0517  TempSrc: Oral  PainSc:          Complications: No apparent anesthesia complications

## 2019-05-25 NOTE — Progress Notes (Signed)
Pre Procedure note for inpatients:   Tony Holloway has been scheduled for Procedure(s): CORONARY ARTERY BYPASS GRAFTING (CABG) (N/A) TRANSESOPHAGEAL ECHOCARDIOGRAM (TEE) (N/A) today. The various methods of treatment have been discussed with the patient. After consideration of the risks, benefits and treatment options the patient has consented to the planned procedure.   The patient has been seen and labs reviewed. There are no changes in the patient's condition to prevent proceeding with the planned procedure today.  Recent labs:  Lab Results  Component Value Date   WBC 9.9 05/25/2019   HGB 13.0 05/25/2019   HCT 39.3 05/25/2019   PLT 252 05/25/2019   GLUCOSE 102 (H) 05/25/2019   CHOL 114 01/08/2019   TRIG 119 01/08/2019   HDL 31 (L) 01/08/2019   LDLCALC 59 01/08/2019   ALT 20 05/23/2019   AST 27 05/23/2019   NA 139 05/25/2019   K 3.7 05/25/2019   CL 105 05/25/2019   CREATININE 1.07 05/25/2019   BUN 12 05/25/2019   CO2 26 05/25/2019   TSH 3.174 05/23/2019   INR 1.0 05/23/2019   HGBA1C 5.7 (H) 05/23/2019    Len Childs, MD 05/25/2019 7:21 AM

## 2019-05-25 NOTE — Telephone Encounter (Signed)
error 

## 2019-05-25 NOTE — Anesthesia Procedure Notes (Signed)
Central Venous Catheter Insertion Performed by: Effie Berkshire, MD, anesthesiologist Start/End6/29/2020 7:00 AM, 05/25/2019 7:05 AM Patient location: Pre-op. Preanesthetic checklist: patient identified, IV checked, site marked, risks and benefits discussed, surgical consent, monitors and equipment checked, pre-op evaluation, timeout performed and anesthesia consent Hand hygiene performed  and maximum sterile barriers used  PA cath was placed.Swan type:thermodilution Procedure performed without using ultrasound guided technique. Attempts: 1 Patient tolerated the procedure well with no immediate complications.

## 2019-05-25 NOTE — Progress Notes (Signed)
RT assessed pts readiness for heart wean. Pt able to hold head off of bed for 20+ sec, wiggle toes, thumbs up bilat, stick tongue out and open eyes upon request. Pt On SIMV/PRVC/PSV VT 600/RR4/ PEEP 5/FIO2 40% PS of 10. RT will reassess at 2025 for readiness to wean to step 3 of process. RT will continue to monitor.

## 2019-05-25 NOTE — Anesthesia Procedure Notes (Addendum)
Central Venous Catheter Insertion Performed by: Effie Berkshire, MD, anesthesiologist Start/End6/29/2020 6:50 AM, 05/25/2019 7:00 AM Patient location: Pre-op. Preanesthetic checklist: patient identified, IV checked, site marked, risks and benefits discussed, surgical consent, monitors and equipment checked, pre-op evaluation, timeout performed and anesthesia consent Position: Trendelenburg Lidocaine 1% used for infiltration and patient sedated Hand hygiene performed , maximum sterile barriers used  and Seldinger technique used Catheter size: 9 Fr Total catheter length 10. Central line was placed.MAC introducer Swan type:thermodilution PA Cath depth:50 Procedure performed using ultrasound guided technique. Ultrasound Notes:anatomy identified, needle tip was noted to be adjacent to the nerve/plexus identified, no ultrasound evidence of intravascular and/or intraneural injection and image(s) printed for medical record Attempts: 1 Following insertion, line sutured and dressing applied. Post procedure assessment: blood return through all ports, free fluid flow and no air  Patient tolerated the procedure well with no immediate complications.

## 2019-05-25 NOTE — Anesthesia Procedure Notes (Signed)
Procedure Name: Intubation Date/Time: 05/25/2019 7:54 AM Performed by: Neldon Newport, CRNA Pre-anesthesia Checklist: Timeout performed, Patient being monitored, Suction available, Emergency Drugs available and Patient identified Patient Re-evaluated:Patient Re-evaluated prior to induction Oxygen Delivery Method: Circle system utilized Preoxygenation: Pre-oxygenation with 100% oxygen Induction Type: IV induction and Rapid sequence Ventilation: Mask ventilation without difficulty Laryngoscope Size: Mac and 4 Grade View: Grade I Tube type: Oral Tube size: 8.0 mm Number of attempts: 1 Placement Confirmation: breath sounds checked- equal and bilateral,  positive ETCO2 and ETT inserted through vocal cords under direct vision Secured at: 23 cm Tube secured with: Tape Dental Injury: Teeth and Oropharynx as per pre-operative assessment

## 2019-05-25 NOTE — Anesthesia Procedure Notes (Signed)
Arterial Line Insertion Start/End6/29/2020 7:25 AM, 05/25/2019 7:30 AM Performed by: Effie Berkshire, MD, anesthesiologist  Patient location: Pre-op. Preanesthetic checklist: patient identified, IV checked, site marked, risks and benefits discussed, surgical consent, monitors and equipment checked, pre-op evaluation, timeout performed and anesthesia consent Lidocaine 1% used for infiltration Left, radial was placed Catheter size: 20 Fr Hand hygiene performed  and maximum sterile barriers used   Attempts: 1 Procedure performed without using ultrasound guided technique. Following insertion, dressing applied. Post procedure assessment: normal and unchanged

## 2019-05-25 NOTE — Progress Notes (Signed)
Pt ABG pH 7.329 PCO2 44.8 PaO2 102 HCO3 23.6. Pt VC .9 L and NIF -30. RT will proceed to heart wean extubation step at this time. RT will continue to monitor.

## 2019-05-26 ENCOUNTER — Inpatient Hospital Stay (HOSPITAL_COMMUNITY): Payer: PPO

## 2019-05-26 ENCOUNTER — Encounter (HOSPITAL_COMMUNITY): Payer: Self-pay | Admitting: Cardiothoracic Surgery

## 2019-05-26 DIAGNOSIS — Z951 Presence of aortocoronary bypass graft: Secondary | ICD-10-CM

## 2019-05-26 DIAGNOSIS — I25110 Atherosclerotic heart disease of native coronary artery with unstable angina pectoris: Principal | ICD-10-CM

## 2019-05-26 HISTORY — DX: Presence of aortocoronary bypass graft: Z95.1

## 2019-05-26 LAB — POCT I-STAT 7, (LYTES, BLD GAS, ICA,H+H)
Acid-base deficit: 1 mmol/L (ref 0.0–2.0)
Acid-base deficit: 1 mmol/L (ref 0.0–2.0)
Bicarbonate: 23.8 mmol/L (ref 20.0–28.0)
Bicarbonate: 23.5 mmol/L (ref 20.0–28.0)
Calcium, Ion: 1.22 mmol/L (ref 1.15–1.40)
Calcium, Ion: 1.23 mmol/L (ref 1.15–1.40)
HCT: 26 % — ABNORMAL LOW (ref 39.0–52.0)
HCT: 29 % — ABNORMAL LOW (ref 39.0–52.0)
Hemoglobin: 8.8 g/dL — ABNORMAL LOW (ref 13.0–17.0)
Hemoglobin: 9.9 g/dL — ABNORMAL LOW (ref 13.0–17.0)
O2 Saturation: 91 %
O2 Saturation: 91 %
Patient temperature: 37.6
Patient temperature: 98.8
Potassium: 4.3 mmol/L (ref 3.5–5.1)
Potassium: 4.2 mmol/L (ref 3.5–5.1)
Sodium: 139 mmol/L (ref 135–145)
Sodium: 137 mmol/L (ref 135–145)
TCO2: 25 mmol/L (ref 22–32)
TCO2: 25 mmol/L (ref 22–32)
pCO2 arterial: 40.4 mmHg (ref 32.0–48.0)
pCO2 arterial: 35.8 mmHg (ref 32.0–48.0)
pH, Arterial: 7.381 (ref 7.350–7.450)
pH, Arterial: 7.427 (ref 7.350–7.450)
pO2, Arterial: 65 mmHg — ABNORMAL LOW (ref 83.0–108.0)
pO2, Arterial: 60 mmHg — ABNORMAL LOW (ref 83.0–108.0)

## 2019-05-26 LAB — BASIC METABOLIC PANEL
Anion gap: 6 (ref 5–15)
Anion gap: 8 (ref 5–15)
BUN: 13 mg/dL (ref 8–23)
BUN: 16 mg/dL (ref 8–23)
CO2: 23 mmol/L (ref 22–32)
CO2: 22 mmol/L (ref 22–32)
Calcium: 8.1 mg/dL — ABNORMAL LOW (ref 8.9–10.3)
Calcium: 8.5 mg/dL — ABNORMAL LOW (ref 8.9–10.3)
Chloride: 108 mmol/L (ref 98–111)
Chloride: 106 mmol/L (ref 98–111)
Creatinine, Ser: 0.95 mg/dL (ref 0.61–1.24)
Creatinine, Ser: 1.13 mg/dL (ref 0.61–1.24)
GFR calc Af Amer: >60 mL/min (ref >60)
GFR calc Af Amer: >60 mL/min (ref >60)
GFR calc non Af Amer: >60 mL/min (ref >60)
GFR calc non Af Amer: >60 mL/min (ref >60)
Glucose, Bld: 123 mg/dL — ABNORMAL HIGH (ref 70–99)
Glucose, Bld: 143 mg/dL — ABNORMAL HIGH (ref 70–99)
Potassium: 4.2 mmol/L (ref 3.5–5.1)
Potassium: 4.1 mmol/L (ref 3.5–5.1)
Sodium: 137 mmol/L (ref 135–145)
Sodium: 136 mmol/L (ref 135–145)

## 2019-05-26 LAB — POCT I-STAT, CHEM 8
BUN: 14 mg/dL (ref 8–23)
Calcium, Ion: 1.2 mmol/L (ref 1.15–1.40)
Chloride: 104 mmol/L (ref 98–111)
Creatinine, Ser: 0.8 mg/dL (ref 0.61–1.24)
Glucose, Bld: 155 mg/dL — ABNORMAL HIGH (ref 70–99)
HCT: 28 % — ABNORMAL LOW (ref 39.0–52.0)
Hemoglobin: 9.5 g/dL — ABNORMAL LOW (ref 13.0–17.0)
Potassium: 4.3 mmol/L (ref 3.5–5.1)
Sodium: 137 mmol/L (ref 135–145)
TCO2: 24 mmol/L (ref 22–32)

## 2019-05-26 LAB — CBC
HCT: 27 % — ABNORMAL LOW (ref 39.0–52.0)
HCT: 28.6 % — ABNORMAL LOW (ref 39.0–52.0)
Hemoglobin: 9.2 g/dL — ABNORMAL LOW (ref 13.0–17.0)
Hemoglobin: 9.4 g/dL — ABNORMAL LOW (ref 13.0–17.0)
MCH: 29.5 pg (ref 26.0–34.0)
MCH: 28.7 pg (ref 26.0–34.0)
MCHC: 34.1 g/dL (ref 30.0–36.0)
MCHC: 32.9 g/dL (ref 30.0–36.0)
MCV: 86.5 fL (ref 80.0–100.0)
MCV: 87.2 fL (ref 80.0–100.0)
Platelets: 198 10*3/uL (ref 150–400)
Platelets: 217 10*3/uL (ref 150–400)
RBC: 3.12 MIL/uL — ABNORMAL LOW (ref 4.22–5.81)
RBC: 3.28 MIL/uL — ABNORMAL LOW (ref 4.22–5.81)
RDW: 13.4 % (ref 11.5–15.5)
RDW: 13.5 % (ref 11.5–15.5)
WBC: 15.7 10*3/uL — ABNORMAL HIGH (ref 4.0–10.5)
WBC: 19.2 10*3/uL — ABNORMAL HIGH (ref 4.0–10.5)
nRBC: 0 % (ref 0.0–0.2)
nRBC: 0 % (ref 0.0–0.2)

## 2019-05-26 LAB — GLUCOSE, CAPILLARY
Glucose-Capillary: 114 mg/dL — ABNORMAL HIGH (ref 70–99)
Glucose-Capillary: 116 mg/dL — ABNORMAL HIGH (ref 70–99)
Glucose-Capillary: 116 mg/dL — ABNORMAL HIGH (ref 70–99)
Glucose-Capillary: 118 mg/dL — ABNORMAL HIGH (ref 70–99)
Glucose-Capillary: 118 mg/dL — ABNORMAL HIGH (ref 70–99)
Glucose-Capillary: 121 mg/dL — ABNORMAL HIGH (ref 70–99)
Glucose-Capillary: 122 mg/dL — ABNORMAL HIGH (ref 70–99)
Glucose-Capillary: 122 mg/dL — ABNORMAL HIGH (ref 70–99)
Glucose-Capillary: 123 mg/dL — ABNORMAL HIGH (ref 70–99)
Glucose-Capillary: 129 mg/dL — ABNORMAL HIGH (ref 70–99)
Glucose-Capillary: 130 mg/dL — ABNORMAL HIGH (ref 70–99)
Glucose-Capillary: 135 mg/dL — ABNORMAL HIGH (ref 70–99)
Glucose-Capillary: 136 mg/dL — ABNORMAL HIGH (ref 70–99)
Glucose-Capillary: 138 mg/dL — ABNORMAL HIGH (ref 70–99)
Glucose-Capillary: 141 mg/dL — ABNORMAL HIGH (ref 70–99)

## 2019-05-26 LAB — COOXEMETRY PANEL
Carboxyhemoglobin: 1.6 % — ABNORMAL HIGH (ref 0.5–1.5)
Methemoglobin: 1 % (ref 0.0–1.5)
O2 Saturation: 59.1 %
Total hemoglobin: 9.8 g/dL — ABNORMAL LOW (ref 12.0–16.0)

## 2019-05-26 LAB — MAGNESIUM
Magnesium: 2.4 mg/dL (ref 1.7–2.4)
Magnesium: 2.2 mg/dL (ref 1.7–2.4)

## 2019-05-26 MED ORDER — CYCLOBENZAPRINE HCL 10 MG PO TABS
10.0000 mg | ORAL_TABLET | Freq: Three times a day (TID) | ORAL | Status: DC | PRN
  Administered 2019-05-27: 10 mg via ORAL
  Filled 2019-05-26 (×2): qty 1

## 2019-05-26 MED ORDER — FUROSEMIDE 10 MG/ML IJ SOLN
40.0000 mg | Freq: Two times a day (BID) | INTRAMUSCULAR | Status: DC
  Administered 2019-05-26 – 2019-05-27 (×4): 40 mg via INTRAVENOUS
  Filled 2019-05-26 (×3): qty 4

## 2019-05-26 MED ORDER — FUROSEMIDE 10 MG/ML IJ SOLN: 40.0000 mg | Freq: Once | INTRAMUSCULAR | Status: DC

## 2019-05-26 MED ORDER — POTASSIUM CHLORIDE CRYS ER 20 MEQ PO TBCR
20.0000 meq | EXTENDED_RELEASE_TABLET | Freq: Every day | ORAL | Status: DC
  Administered 2019-05-26 – 2019-05-27 (×2): 20 meq via ORAL
  Filled 2019-05-26 (×2): qty 1

## 2019-05-26 MED ORDER — ENOXAPARIN SODIUM 40 MG/0.4ML ~~LOC~~ SOLN
40.0000 mg | Freq: Every day | SUBCUTANEOUS | Status: DC
  Administered 2019-05-26 – 2019-05-31 (×6): 40 mg via SUBCUTANEOUS
  Filled 2019-05-26 (×6): qty 0.4

## 2019-05-26 MED ORDER — INSULIN ASPART 100 UNIT/ML ~~LOC~~ SOLN
0.0000 [IU] | SUBCUTANEOUS | Status: DC
  Administered 2019-05-26 – 2019-05-27 (×4): 2 [IU] via SUBCUTANEOUS

## 2019-05-26 NOTE — Progress Notes (Signed)
Patient ID: Tony Holloway, male   DOB: [birth year], 73 y.o.   MRN: [id number] [facility] Evening Rounds:  Hemodynamically stable in sinus rhythm.  Milrinone 0.125  Urine output good  BMET    Component Value Date/Time   NA 136 05/26/2019 1625   NA 139 05/18/2019 1131   K 4.1 05/26/2019 1625   CL 106 05/26/2019 1625   CO2 22 05/26/2019 1625   GLUCOSE 143 (H) 05/26/2019 1625   BUN 16 05/26/2019 1625   BUN 14 05/18/2019 1131   CREATININE 1.13 05/26/2019 1625   CALCIUM 8.5 (L) 05/26/2019 1625   GFRNONAA >60 05/26/2019 1625   GFRAA >60 05/26/2019 1625   CBC    Component Value Date/Time   WBC 19.2 (H) 05/26/2019 1625   RBC 3.28 (L) 05/26/2019 1625   HGB 9.4 (L) 05/26/2019 1625   HGB 15.4 05/18/2019 1131   HCT 28.6 (L) 05/26/2019 1625   HCT 45.0 05/18/2019 1131   PLT 217 05/26/2019 1625   PLT 277 05/18/2019 1131   MCV 87.2 05/26/2019 1625   MCV 86 05/18/2019 1131   MCH 28.7 05/26/2019 1625   MCHC 32.9 05/26/2019 1625   RDW 13.5 05/26/2019 1625   RDW 13.4 05/18/2019 1131

## 2019-05-26 NOTE — Progress Notes (Signed)
Progress Note  Patient Name: Tony Holloway Date of Encounter: 05/26/2019  Primary Cardiologist: No primary care provider on file.   Subjective   Mild chest soreness this am. Overall feeling well.   Inpatient Medications    Scheduled Meds:  acetaminophen  1,000 mg Oral Q6H   Or   acetaminophen (TYLENOL) oral liquid 160 mg/5 mL  1,000 mg Per Tube Q6H   aspirin EC  325 mg Oral Daily   Or   aspirin  324 mg Per Tube Daily   bisacodyl  10 mg Oral Daily   Or   bisacodyl  10 mg Rectal Daily   Chlorhexidine Gluconate Cloth  6 each Topical Daily   Chlorhexidine Gluconate Cloth  6 each Topical Daily   docusate sodium  200 mg Oral Daily   enoxaparin (LOVENOX) injection  40 mg Subcutaneous QHS   furosemide  40 mg Intravenous BID   insulin aspart  0-24 Units Subcutaneous Q4H   loratadine  10 mg Oral Daily   mouth rinse  15 mL Mouth Rinse BID   metoprolol tartrate  12.5 mg Oral BID   Or   metoprolol tartrate  12.5 mg Per Tube BID   montelukast  10 mg Oral QHS   [START ON 05/27/2019] pantoprazole  40 mg Oral Daily   potassium chloride  20 mEq Oral Daily   pravastatin  20 mg Oral q1800   sodium chloride flush  10-40 mL Intracatheter Q12H   Continuous Infusions:  sodium chloride 20 mL/hr at 05/26/19 0700   sodium chloride     sodium chloride 20 mL/hr at 05/25/19 1520   albumin human 12.5 g (05/25/19 2140)   cefUROXime (ZINACEF)  IV Stopped (05/26/19 0429)   insulin 3.2 mL/hr at 05/26/19 0700   lactated ringers     lactated ringers 20 mL/hr at 05/26/19 0700   milrinone 0.125 mcg/kg/min (05/26/19 0700)   nitroGLYCERIN 0 mcg/min (05/25/19 1450)   phenylephrine (NEO-SYNEPHRINE) Adult infusion Stopped (05/26/19 0445)   PRN Meds: sodium chloride, albumin human, metoprolol tartrate, midazolam, morphine injection, ondansetron (ZOFRAN) IV, oxyCODONE, sodium chloride flush, traMADol   Vital Signs    Vitals:   05/26/19 0645 05/26/19 0700 05/26/19 0715  05/26/19 0730  BP:  (!) 112/56    Pulse: 87 87 88 87  Resp: 20 20 16 14   Temp: 99.9 F (37.7 C) 99.9 F (37.7 C) 99.7 F (37.6 C) 99.7 F (37.6 C)  TempSrc:      SpO2: 95% 95% 94% 95%  Weight:      Height:        Intake/Output Summary (Last 24 hours) at 05/26/2019 0812 Last data filed at 05/26/2019 0700 Gross per 24 hour  Intake 6176.09 ml  Output 4033 ml  Net 2143.09 ml   Last 3 Weights 05/26/2019 05/25/2019 05/24/2019  Weight (lbs) 260 lb 2.3 oz 246 lb 6.4 oz 246 lb 14.4 oz  Weight (kg) 118 kg 111.766 kg 111.993 kg      Telemetry    Pacing - Personally Reviewed  ECG     sinus brady or junctional brady- Personally Reviewed  Physical Exam   GEN: No acute distress.   Neck: No JVD Cardiac: RRR, no murmurs, rubs, or gallops.  Respiratory: Clear to auscultation bilaterally. GI: Soft, nontender, non-distended  MS: Trace bilateral LE edema; No deformity. Neuro:  Nonfocal  Psych: Normal affect   Labs    High Sensitivity Troponin:  No results for input(s): TROPONINIHS in the last 720 hours.  Cardiac EnzymesNo results for input(s): TROPONINI in the last 168 hours. No results for input(s): TROPIPOC in the last 168 hours.   Chemistry Recent Labs  Lab 05/23/19 0331 05/25/19 0511  05/25/19 1339 05/25/19 1458  05/25/19 2047  05/25/19 2231 05/26/19 0246 05/26/19 0458  NA 141 139   < > 139 140   < >  --    < > 139 137 139  K 4.7 3.7   < > 4.0 4.1   < >  --    < > 4.3 4.2 4.3  CL 108 105  --   --   --   --   --   --   --  108  --   CO2 26 26  --   --   --   --   --   --   --  23  --   GLUCOSE 102* 102*   < > 122* 109*  --   --   --   --  123*  --   BUN 9 12  --   --   --   --   --   --   --  13  --   CREATININE 1.07 1.07  --   --   --   --  1.03  --   --  0.95  --   CALCIUM 8.7* 9.2  --   --   --   --   --   --   --  8.1*  --   PROT 5.2*  --   --   --   --   --   --   --   --   --   --   ALBUMIN 3.0*  --   --   --   --   --   --   --   --   --   --   AST 27  --    --   --   --   --   --   --   --   --   --   ALT 20  --   --   --   --   --   --   --   --   --   --   ALKPHOS 72  --   --   --   --   --   --   --   --   --   --   BILITOT 0.6  --   --   --   --   --   --   --   --   --   --   GFRNONAA >60 >60  --   --   --   --  >60  --   --  >60  --   GFRAA >60 >60  --   --   --   --  >60  --   --  >60  --   ANIONGAP 7 8  --   --   --   --   --   --   --  6  --    < > = values in this interval not displayed.     Hematology Recent Labs  Lab 05/25/19 1500  05/25/19 2047  05/25/19 2231 05/26/19 0246 05/26/19 0458  WBC 18.3*  --  16.2*  --   --  15.7*  --   RBC 3.54*  --  3.45*  --   --  3.12*  --   HGB 10.2*   < > 10.1*   < > 10.2* 9.2* 8.8*  HCT 31.1*   < > 30.2*   < > 30.0* 27.0* 26.0*  MCV 87.9  --  87.5  --   --  86.5  --   MCH 28.8  --  29.3  --   --  29.5  --   MCHC 32.8  --  33.4  --   --  34.1  --   RDW 13.1  --  13.3  --   --  13.4  --   PLT 194  --  223  --   --  198  --    < > = values in this interval not displayed.    BNPNo results for input(s): BNP, PROBNP in the last 168 hours.   DDimer No results for input(s): DDIMER in the last 168 hours.   Radiology    Dg Chest Port 1 View  Result Date: 05/26/2019 CLINICAL DATA:  Status post CABG. EXAM: PORTABLE CHEST 1 VIEW COMPARISON:  05/25/2019. FINDINGS: Interim extubation and removal of NG tube. Mediastinal drainage catheter, Swan-Ganz catheter, left chest tube in stable position. Prior CABG. Cardiomegaly. Bibasilar atelectasis/infiltrates. No pleural effusion or pneumothorax. IMPRESSION: 1. Interim extubation removal of NG tube. Mediastinal drainage catheter, Swan-Ganz catheter, left chest tube in stable position. No pneumothorax. 2. Persistent bibasilar atelectasis/infiltrates. No pleural effusion noted on today's exam. 3.  Prior CABG.  Stable cardiomegaly. Electronically Signed   By: Marcello Moores  Register   On: 05/26/2019 07:03   Dg Chest Port 1 View  Result Date: 05/25/2019 CLINICAL  DATA:  Status post coronary artery bypass graft. EXAM: PORTABLE CHEST 1 VIEW COMPARISON:  Radiographs of May 23, 2019. FINDINGS: Endotracheal and nasogastric tubes are in grossly good position. Left-sided chest tube is noted without pneumothorax. Right internal jugular Swan-Ganz catheter is noted with distal tip in expected position of main pulmonary artery. Bibasilar atelectasis and effusions are noted. Bony thorax is unremarkable. IMPRESSION: Endotracheal and nasogastric tubes in good position. Left-sided chest tube is noted without pneumothorax. Bibasilar atelectasis and effusions are noted Electronically Signed   By: Marijo Conception M.D.   On: 05/25/2019 15:16    Cardiac Studies     Patient Profile     73 y.o. 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 He underwent 4V CABG on 05/25/19. LVEF normal by echo.   Assessment & Plan    1. CAD/Unstable angina: He is s/p 4V CABG. He is still stable this am. BP stable. Sinus brady this am under pacer. Telemetry reviewed by me. Still requiring temp pacing. Mild volume overload. Agree with IV Lasix today. Continue ASA, statin and beta blocker.   For questions or updates, please contact [place] Please consult [email or web address] for contact info under        Signed, Lauree Chandler, MD  05/26/2019, 8:12 AM

## 2019-05-26 NOTE — Progress Notes (Addendum)
[facility] DAILY ICU PROGRESS NOTE                   [address]            [place] [address]          [phone]   1 Day Post-Op Procedure(s) (LRB): CORONARY ARTERY BYPASS GRAFTING (CABG) times four using left internal mammary artery and right leg saphenous vein (N/A) TRANSESOPHAGEAL ECHOCARDIOGRAM (TEE) (N/A)  Total Length of Stay:  LOS: 4 days   Subjective:  Doing okay.  Has some chest discomfort, gets relief with medication.  midlly short of breath.  Notices he is swollen.  Objective: Vital signs in last 24 hours: Temp:  [97 F (36.1 C)-100 F (37.8 C)] 99.9 F (37.7 C) (06/30 0700) Pulse Rate:  [87-89] 87 (06/30 0700) Cardiac Rhythm: Atrial paced (06/30 0400) Resp:  [12-29] 20 (06/30 0700) BP: (100-124)/(55-94) 112/56 (06/30 0700) SpO2:  [88 %-100 %] 95 % (06/30 0700) Arterial Line BP: (68-137)/(38-68) 115/42 (06/30 0700) FiO2 (%):  [40 %-50 %] 40 % (06/29 2025) Weight:  [332 kg] 118 kg (06/30 0500)  Filed Weights   05/24/19 0412 05/25/19 0517 05/26/19 0500  Weight: 112 kg 111.8 kg 118 kg    Weight change: 6.234 kg   Hemodynamic parameters for last 24 hours: PAP: (22-38)/(4-18) 26/9 CO:  [4.3 L/min-7.4 L/min] 7.4 L/min CI:  [1.9 L/min/m2-3.2 L/min/m2] 3.2 L/min/m2  Intake/Output from previous day: 06/29 0701 - 06/30 0700 In: 6176.1 [P.O.:120; I.V.:3616.2; Blood:500; IV Piggyback:1939.9] Out: 9518 [Urine:2435; Blood:1050; Chest Tube:548]  Current Meds: Scheduled Meds: . acetaminophen  1,000 mg Oral Q6H   Or  . acetaminophen (TYLENOL) oral liquid 160 mg/5 mL  1,000 mg Per Tube Q6H  . aspirin EC  325 mg Oral Daily   Or  . aspirin  324 mg Per Tube Daily  . bisacodyl  10 mg Oral Daily   Or  . bisacodyl  10 mg Rectal Daily  . Chlorhexidine Gluconate Cloth  6 each Topical Daily  . Chlorhexidine Gluconate Cloth  6 each Topical Daily  . docusate sodium  200 mg Oral Daily  . enoxaparin (LOVENOX) injection  40 mg Subcutaneous QHS  . insulin  aspart  0-24 Units Subcutaneous Q4H  . insulin regular  0-10 Units Intravenous TID WC  . loratadine  10 mg Oral Daily  . mouth rinse  15 mL Mouth Rinse BID  . metoprolol tartrate  12.5 mg Oral BID   Or  . metoprolol tartrate  12.5 mg Per Tube BID  . montelukast  10 mg Oral QHS  . [START ON 05/27/2019] pantoprazole  40 mg Oral Daily  . pravastatin  20 mg Oral q1800  . sodium chloride flush  10-40 mL Intracatheter Q12H   Continuous Infusions: . sodium chloride 20 mL/hr at 05/26/19 0700  . sodium chloride    . sodium chloride 20 mL/hr at 05/25/19 1520  . albumin human 12.5 g (05/25/19 2140)  . cefUROXime (ZINACEF)  IV Stopped (05/26/19 0429)  . dexmedetomidine (PRECEDEX) IV infusion Stopped (05/25/19 2056)  . insulin 3.2 mL/hr at 05/26/19 0700  . lactated ringers    . lactated ringers    . lactated ringers 20 mL/hr at 05/26/19 0700  . milrinone 0.125 mcg/kg/min (05/26/19 0700)  . nitroGLYCERIN 0 mcg/min (05/25/19 1450)  . phenylephrine (NEO-SYNEPHRINE) Adult infusion Stopped (05/26/19 0445)   PRN Meds:.sodium chloride, albumin human, lactated ringers, metoprolol tartrate, midazolam, morphine injection, ondansetron (ZOFRAN) IV, oxyCODONE, sodium chloride  flush, traMADol  General appearance: alert, cooperative and no distress Heart: regular rate and rhythm Lungs: clear to auscultation bilaterally Abdomen: soft, non-tender; bowel sounds normal; no masses,  no organomegaly Extremities: edema 1+ Wound: clean and dry EVH sites, Aquacel in place on sternotomy  Lab Results: CBC: Recent Labs    05/25/19 2047  05/26/19 0246 05/26/19 0458  WBC 16.2*  --  15.7*  --   HGB 10.1*   < > 9.2* 8.8*  HCT 30.2*   < > 27.0* 26.0*  PLT 223  --  198  --    < > = values in this interval not displayed.   BMET:  Recent Labs    05/25/19 0511  05/25/19 1458  05/25/19 2047  05/26/19 0246 05/26/19 0458  NA 139   < > 140   < >  --    < > 137 139  K 3.7   < > 4.1   < >  --    < > 4.2 4.3  CL  105  --   --   --   --   --  108  --   CO2 26  --   --   --   --   --  23  --   GLUCOSE 102*   < > 109*  --   --   --  123*  --   BUN 12  --   --   --   --   --  13  --   CREATININE 1.07  --   --   --  1.03  --  0.95  --   CALCIUM 9.2  --   --   --   --   --  8.1*  --    < > = values in this interval not displayed.    CMET: Lab Results  Component Value Date   WBC 15.7 (H) 05/26/2019   HGB 8.8 (L) 05/26/2019   HCT 26.0 (L) 05/26/2019   PLT 198 05/26/2019   GLUCOSE 123 (H) 05/26/2019   CHOL 114 01/08/2019   TRIG 119 01/08/2019   HDL 31 (L) 01/08/2019   LDLCALC 59 01/08/2019   ALT 20 05/23/2019   AST 27 05/23/2019   NA 139 05/26/2019   K 4.3 05/26/2019   CL 108 05/26/2019   CREATININE 0.95 05/26/2019   BUN 13 05/26/2019   CO2 23 05/26/2019   TSH 3.174 05/23/2019   INR 1.4 (H) 05/25/2019   HGBA1C 5.7 (H) 05/23/2019      PT/INR:  Recent Labs    05/25/19 1500  LABPROT 17.4*  INR 1.4*   Radiology: Dg Chest Port 1 View  Result Date: 05/26/2019 CLINICAL DATA:  Status post CABG. EXAM: PORTABLE CHEST 1 VIEW COMPARISON:  05/25/2019. FINDINGS: Interim extubation and removal of NG tube. Mediastinal drainage catheter, Swan-Ganz catheter, left chest tube in stable position. Prior CABG. Cardiomegaly. Bibasilar atelectasis/infiltrates. No pleural effusion or pneumothorax. IMPRESSION: 1. Interim extubation removal of NG tube. Mediastinal drainage catheter, Swan-Ganz catheter, left chest tube in stable position. No pneumothorax. 2. Persistent bibasilar atelectasis/infiltrates. No pleural effusion noted on today's exam. 3.  Prior CABG.  Stable cardiomegaly. Electronically Signed   By: Marcello Moores  Register   On: 05/26/2019 07:03   Dg Chest Port 1 View  Result Date: 05/25/2019 CLINICAL DATA:  Status post coronary artery bypass graft. EXAM: PORTABLE CHEST 1 VIEW COMPARISON:  Radiographs of May 23, 2019. FINDINGS: Endotracheal and nasogastric tubes are in grossly good  position. Left-sided chest  tube is noted without pneumothorax. Right internal jugular Swan-Ganz catheter is noted with distal tip in expected position of main pulmonary artery. Bibasilar atelectasis and effusions are noted. Bony thorax is unremarkable. IMPRESSION: Endotracheal and nasogastric tubes in good position. Left-sided chest tube is noted without pneumothorax. Bibasilar atelectasis and effusions are noted Electronically Signed   By: Marijo Conception M.D.   On: 05/25/2019 15:16     Assessment/Plan: S/P Procedure(s) (LRB): CORONARY ARTERY BYPASS GRAFTING (CABG) times four using left internal mammary artery and right leg saphenous vein (N/A) TRANSESOPHAGEAL ECHOCARDIOGRAM (TEE) (N/A)  1. CV- Sinus Bradycardia, currently temporarily paced- on Milrinone at 0.125 mg will wean as able 2. Pulm- aggressive pulmonary toilet, wean oxygen as able, CXR without pneumothorax, significant effusions 3. Renal- creatinine WNL, weight is up 12 lbs since admission, will give IV lasix today, supplement K 4. Expected post operative blood loss anemia, stable Hgb at 8.8 5. CBGs-controlled, not currently on insulin drip, will continue SSIP coverage 6. Dispo- patient stable, bradycardic under temporary pacer, wean milrinone as able, start diuretics today, POD #1 progression orders     Caryn Bee [phone] 05/26/2019 7:36 AM   Diuresis and mobilize  patient examined and medical record reviewed,agree with above note. Tharon Aquas Trigt III 05/26/2019

## 2019-05-26 NOTE — Anesthesia Postprocedure Evaluation (Signed)
Anesthesia Post Note  Patient: Tony Holloway  Procedure(s) Performed: CORONARY ARTERY BYPASS GRAFTING (CABG) times four using left internal mammary artery and right leg saphenous vein (N/A Chest) TRANSESOPHAGEAL ECHOCARDIOGRAM (TEE) (N/A Esophagus)     Patient location during evaluation: SICU Anesthesia Type: General Level of consciousness: awake Pain management: pain level controlled Vital Signs Assessment: post-procedure vital signs reviewed and stable Respiratory status: spontaneous breathing Cardiovascular status: stable Postop Assessment: no apparent nausea or vomiting Anesthetic complications: no Comments: Pt awake with minimal pain, tolerating PO  Milrinone gtt only.     Last Vitals:  Vitals:   05/26/19 1700 05/26/19 1800  BP: 121/69 (!) 137/53  Pulse: 88 69  Resp: (!) 26 (!) 30  Temp:    SpO2: (!) 88% 91%    Last Pain:  Vitals:   05/26/19 1600  TempSrc: Oral  PainSc:                  Effie Berkshire

## 2019-05-26 NOTE — Op Note (Signed)
NAME: Tony Holloway, Tony Holloway MEDICAL RECORD AO:[id number] ACCOUNT [id number] DATE OF BIRTH:Mar 16, 1946 FACILITY: [facility] LOCATION: [facility] PHYSICIAN:PETER VAN TRIGT III, MD  OPERATIVE REPORT  DATE OF PROCEDURE:  05/25/2019  OPERATION: 1.  Coronary artery bypass grafting x4 (left internal mammary artery to left anterior descending, saphenous vein graft to ramus intermediate, saphenous vein graft to posterior descending, saphenous vein graft to distal posterolateral). 2.  Endoscopic harvest of right leg greater saphenous vein.  SURGEON:  Ivin Poot, MD  ASSISTANT:  Ellwood Handler, PA-C  PREOPERATIVE DIAGNOSIS:  Left main stenosis with unstable angina, severe 3-vessel coronary artery disease.  POSTOPERATIVE DIAGNOSIS:  Left main stenosis with unstable angina, severe 3-vessel coronary artery disease.  ANESTHESIA:  General by Dr. Suella Broad.  DESCRIPTION OF PROCEDURE:  The patient was brought from preop holding where informed consent was documented and final questions addressed.  The patient had previously been counseled on the details of multivessel CABG for treatment of his severe left main  and 3-vessel coronary artery disease.  I reviewed his echocardiogram, his coronary angiograms and his general medical condition to discuss his expected benefits and risks.  The patient was brought to the OR and placed supine on the OR table and general anesthesia was induced under invasive hemodynamic monitoring.  A transesophageal echo probe was placed by the anesthesia team.  The patient was prepped and draped as a  sterile field and a proper time-out was performed.  A sternal incision was made as the saphenous vein was harvested endoscopically from the right leg.  The left internal mammary artery was harvested as a pedicle graft from its origin at the subclavian  vessels.  It was a good vessel with excellent flow.  The sternal retractor was placed using the deep blades because of the patient's obese body  habitus.  The pericardium was opened and suspended.  The heart and aorta were inspected.  The aorta had  minimal calcification.  The heart was dilated with a large thick layer of epicardial fat.  Heparin was administered and 2 pursestrings were placed in the ascending aorta and right atrium.  The patient was cannulated and placed on cardiopulmonary bypass.   The coronaries were then dissected and identified for grafting.  They were diffusely diseased, but adequate targets.  The circumflex marginal branch was too small to graft.  The distal circumflex posterolateral was graftable.  Cardioplegia cannulas were  placed both antegrade and retrograde cold blood cardioplegia, and the aortic root and coronary sinus.  The patient was cooled to 32 degrees, and the aortic crossclamp was applied.  One liter of cold blood cardioplegia was delivered in split doses  between the antegrade aortic and retrograde coronary sinus catheters.  There was good cardioplegic arrest and supple temperature dropped less than 14 degrees.  Cardioplegia was delivered every 20 minutes.  The distal coronary anastomoses were performed.  First, distal anastomosis was to the posterior descending branch of right coronary.  This was a proximal 95% stenosis.  A reverse saphenous vein was sewn end-to-side with running 7-0 Prolene.  There was good flow through the graft.  Cardioplegia was  redosed.  The second distal anastomosis was the posterolateral branch of the distal circumflex.  This was a 1.5 mm vessel, proximal 80% to 90% left main stenosis.  A reverse saphenous vein was sewn end-to-side with running 7-0 Prolene with good flow through the  graft.  Cardioplegia was redosed.  The third distal anastomosis was the ramus intermediate branch of the left coronary.  This was intramyocardial.  It was a 1.6 mm vessel with proximal left main stenosis.  A reverse saphenous vein was sewn end-to-side with running 7-0 Prolene with good  flow through  the graft.  Cardioplegia was redosed.  The fourth distal anastomosis was the mid to distal third of the LAD.  There was a proximal 90% left main stenosis.  The left IMA pedicle was brought through an opening in the left lateral pericardium and was brought down onto the LAD and sewn  end-to-side with running 8-0 Prolene.  There was good flow through the anastomosis after briefly releasing the pedicle bulldog and the mammary artery.  The bulldog was reapplied, and the pedicle secured to the epicardium.  Cardioplegia was redosed.  The cross clamp was still in place 3 proximal vein anastomoses were performed on the ascending aorta using a 4.5 mm punch and a running 6-0 Prolene.  Prior to tying down the final proximal anastomosis, air was vented from the coronaries with a dose of  retrograde warm blood cardioplegia.  The crossclamp was removed.  The heart resumed a spontaneous rhythm.  The vein grafts were deaired and opened and each had good flow.  Hemostasis was documented at the proximal and distal anastomoses.  The cardioplegia cannulas were removed.  As the patient was being rewarmed and  reperfused, temporary pacing wires were applied.  The lungs were expanded and the ventilator was resumed.  The patient then weaned off cardiopulmonary bypass without difficulty without requiring inotropes.  LV function appeared normal.  Hemodynamics were  normal.  Echo showed preservation of LV function.  Protamine was administered without adverse reaction.  The cannulas were removed.  The mediastinum was irrigated.  The superior pericardial fat was closed over the aorta.  The anterior mediastinum and  left pleural chest tubes were placed.  The sternum was then closed with #7 wire.  The patient remained stable.  The pectoralis fascia was closed with a running #1 Vicryl.  The subcutaneous and skin layers were closed with running Vicryl.  The patient  remained stable.    Total cardiopulmonary bypass time was 161  minutes.  AN/NUANCE  D:05/25/2019 T:05/26/2019 JOB:[id number]

## 2019-05-26 NOTE — Discharge Summary (Addendum)
Physician Discharge Summary  Patient ID: Tony Holloway MRN: [id number] DOB/AGE: Nov 25, 1946 73 y.o.  Admit date: 05/22/2019 Discharge date: 06/01/2019  Admission Diagnoses:  Patient Active Problem List   Diagnosis Date Noted  . 3-vessel CAD 05/22/2019  . Exertional angina ([place]) 05/18/2019  . Essential hypertension 10/22/2018  . Overweight 10/22/2018   Discharge Diagnoses:   Patient Active Problem List   Diagnosis Date Noted  . Postoperative atrial fibrillation ([place]) 05/29/2019  . Sinus bradycardia seen on cardiac monitor 05/29/2019  . 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 3-vessel CAD 05/22/2019  . Exertional angina ([place]) 05/18/2019  . Essential hypertension 10/22/2018  . Overweight 10/22/2018       Acute renal failure Discharged Condition: good  History of Present Illness:  Tony Holloway is a 73 yo white male with known history of CAD.  This first began in November 2019 when the patient developed an episode of ringing in his left ear.  He presented to the emergency room for evaluation where he was found to be severely hypertensive.  He was told he suffered a heart attack and was referred to Tony Holloway for workup.  He underwent stress testing in December which was unrevealing.  The patient was educated on minimizing his cardiac risk factors.  He changed his diet and started walking and ultimately lost 40 lbs over the next few months.  He was unable to tolerate statin therapy due to myalgias.  He was doing well until a few weeks ago when he again developed chest pain in his mid chest with radiation to his left shoulder with activity.  The patient followed up with Tony Holloway in June of this year and after evaluation it was felt the patient was likely experiencing angina symptoms.  It was felt the patient should undergo cardiac catheterization.  This was performed on 05/22/2019 which showed multivessel CAD.  It was felt the patient would require coronary bypass procedure and he was admitted for further  care.    Hospital Course:   The patient remained chest pain free during his hospitalization.  He was evaluated by Tony Holloway who was in agreement the patient would benefit from bypass surgery.  The risks and benefits of the procedure were explained to the patient and he was agreeable to proceed.  He was taken to the operating room and underwent CABG x 4 utilizing LIMA to LAD, SVG to PDA, SVG to PLVB, and SVG to Ramus Intermediate.  He also underwent endoscopic harvest of greater saphenous vein from his right leg.  He tolerated the procedure without difficulty and was taken to the SICU in stable condition.  The patient was extubated evening of surgery.  The patients chest tubes and arterial lines were removed without difficulty.  He was weaned off Tony Holloway as tolerated.  He was volume overloaded and was diuresed with IV Tony Holloway.  The patient developed rapid Atrial Fibrillation.  He was treated with IV Amiodarone and his Tony Holloway dose was increased.  He converted to NSR.  He was bradycardic under the pacer.  This was weaned off as able.  He was stable for transfer to the telemetry unit on 05/31/2019.  He continued to make progress. He was maintaining NSR.  He had some burning his in right foot which was felt to be related to neuropathic pain aggravated by saphenous vein harvest.  His pacing wires were removed without difficulty. He was ambulating independently.  He is medically stable for discharge home today.    Significant  Diagnostic Studies: angiography:    Prox RCA lesion is 95% stenosed.  Prox LAD lesion is 30% stenosed.  Prox LAD to Mid LAD lesion is 50% stenosed.  Ost LM lesion is 80% stenosed.  Prox Cx to Mid Cx lesion is 40% stenosed.  1st Mrg-1 lesion is 90% stenosed.  1st Mrg-2 lesion is 95% stenosed.  LV end diastolic pressure is low.  There is mild left ventricular systolic dysfunction.  The left ventricular ejection fraction is 50-55% by visual estimate.   Evidence for mild  coronary calcification with significant multivessel CAD with 80% ostial tapering of the left main; 30 and 50% proximal to mid LAD stenoses; 40% proximal circumflex stenosis with 90 and 95% ostial and mid stenosis in the OM1 vessel; and large dominant RCA with 95% ulcerated plaque appearing vessel proximally.  Low normal LV function with EF estimated 50% with small area of focal mid inferior hypocontractility.  LVEDP 8 mmHg  Treatments: surgery:   1.  Coronary artery bypass grafting x4 (left internal mammary artery to left anterior descending, saphenous vein graft to ramus intermediate, saphenous vein graft to posterior descending, saphenous vein graft to distal posterolateral). 2.  Endoscopic harvest of right leg greater saphenous vein.  Discharge Exam: Blood pressure (!) 134/106, pulse 82, temperature 98.2 F (36.8 C), temperature source Oral, resp. rate 19, height 5\' 11"  (1.803 m), weight 112.3 kg, SpO2 97 %.  Cardiovascular: RRR Pulmonary: Slightly diminished left base;otherwise, clear Abdomen: Soft, non tender, bowel sounds present. Extremities: Trace bilateral lower extremity edema R>L Wounds: Clean and dry.  No erythema or signs of infection.  Disposition:    Discharge Medications:  The patient has been discharged on:   1.Beta Blocker:  Yes [ X  ]                              No   [   ]                              If No, reason:  2.Ace Inhibitor/ARB: Yes [   ]                                     No  [ X   ]                                     If No, reason: labile BP  3.Statin:   Yes [ x  ]                  No  [   ]                  If No, reason:  4.Ecasa:  Yes  [ x  ]                  No   [   ]                  If No, reason:     Allergies as of 06/01/2019      Reactions   Codeine Other (See Comments)   Shaking, dizzy, lightheaded      Medication List    STOP  taking these medications   Coricidin HBP 10-325-2 MG Tabs Generic drug: DM-APAP-CPM    doxycycline 100 MG capsule Commonly known as: VIBRAMYCIN     TAKE these medications   amiodarone 200 MG tablet Commonly known as: PACERONE Take 1 tablet (200 mg total) by mouth 2 (two) times daily. X 7 days, then take 200 mg daily   aspirin EC 81 MG tablet Take 81 mg by mouth daily.   cetirizine 10 MG tablet Commonly known as: ZYRTEC Take 10 mg by mouth daily at 12 noon.   cyclobenzaprine 10 MG tablet Commonly known as: FLEXERIL Take 1 tablet (10 mg total) by mouth 3 (three) times daily as needed for muscle spasms.   furosemide 40 MG tablet Commonly known as: Tony Holloway Take 1 tablet (40 mg total) by mouth daily. Start taking on: June 02, 2019   metoprolol succinate 25 MG 24 hr tablet Commonly known as: TOPROL-XL Take 1 tablet (25 mg total) by mouth daily.   montelukast 10 MG tablet Commonly known as: SINGULAIR TAKE ONE TABLET BY MOUTH AT BEDTIME   nitroGLYCERIN 0.4 MG SL tablet Commonly known as: NITROSTAT Place 0.4 mg under the tongue every 5 (five) minutes x 3 doses as needed for chest pain.   omeprazole 20 MG capsule Commonly known as: PRILOSEC Take 20 mg by mouth daily.   OSTEO BI-FLEX JOINT SHIELD PO Take 1 tablet by mouth daily at 12 noon.   potassium chloride SA 20 MEQ tablet Commonly known as: K-DUR Take 2 tablets (40 mEq total) by mouth daily.   pravastatin 20 MG tablet Commonly known as: PRAVACHOL Take 1 tablet (20 mg total) by mouth 2 (two) times daily.   TRACE MIN CACRCUFEKMGMNPSEZN PO Take 1 tablet by mouth daily at 12 noon. Trace Mineral Tablet (OTC)   traMADol 50 MG tablet Commonly known as: ULTRAM Take 1-2 tablets (50-100 mg total) by mouth every 4 (four) hours as needed for moderate pain.   Visine 0.05 % ophthalmic solution Generic drug: tetrahydrozoline Place 1 drop into both eyes 3 (three) times daily as needed (dry/irritated eyes.).      Follow-up Information    Ivin Poot, MD Follow up on 07/01/2019.   Specialty: Cardiothoracic  Surgery Why: Appointment is at 11:00, please get CXR at 10:30 [place] located on first floor of our office building Contact information: [place] [address] [phone]        Revankar, Reita Cliche, MD Follow up on 06/17/2019.   Specialty: Cardiology Why: Appointment is at 9:40 Contact information: [place]  [place] [id number] [phone]           Signed: Ellwood Handler 06/01/2019, 9:43 AM

## 2019-05-27 ENCOUNTER — Inpatient Hospital Stay (HOSPITAL_COMMUNITY): Payer: PPO

## 2019-05-27 DIAGNOSIS — I200 Unstable angina: Secondary | ICD-10-CM

## 2019-05-27 LAB — CBC
HCT: 27.3 % — ABNORMAL LOW (ref 39.0–52.0)
Hemoglobin: 8.8 g/dL — ABNORMAL LOW (ref 13.0–17.0)
MCH: 29 pg (ref 26.0–34.0)
MCHC: 32.2 g/dL (ref 30.0–36.0)
MCV: 90.1 fL (ref 80.0–100.0)
Platelets: 210 10*3/uL (ref 150–400)
RBC: 3.03 MIL/uL — ABNORMAL LOW (ref 4.22–5.81)
RDW: 13.6 % (ref 11.5–15.5)
WBC: 20.4 10*3/uL — ABNORMAL HIGH (ref 4.0–10.5)
nRBC: 0 % (ref 0.0–0.2)

## 2019-05-27 LAB — COOXEMETRY PANEL
Carboxyhemoglobin: 1.2 % (ref 0.5–1.5)
Carboxyhemoglobin: 1.2 % (ref 0.5–1.5)
Methemoglobin: 0.8 % (ref 0.0–1.5)
Methemoglobin: 0.9 % (ref 0.0–1.5)
O2 Saturation: 40.5 %
O2 Saturation: 48.3 %
Total hemoglobin: 9.6 g/dL — ABNORMAL LOW (ref 12.0–16.0)
Total hemoglobin: 9.4 g/dL — ABNORMAL LOW (ref 12.0–16.0)

## 2019-05-27 LAB — BASIC METABOLIC PANEL
Anion gap: 7 (ref 5–15)
BUN: 23 mg/dL (ref 8–23)
CO2: 26 mmol/L (ref 22–32)
Calcium: 8.6 mg/dL — ABNORMAL LOW (ref 8.9–10.3)
Chloride: 103 mmol/L (ref 98–111)
Creatinine, Ser: 1.52 mg/dL — ABNORMAL HIGH (ref 0.61–1.24)
GFR calc Af Amer: 53 mL/min — ABNORMAL LOW (ref >60)
GFR calc non Af Amer: 45 mL/min — ABNORMAL LOW (ref >60)
Glucose, Bld: 144 mg/dL — ABNORMAL HIGH (ref 70–99)
Potassium: 4.3 mmol/L (ref 3.5–5.1)
Sodium: 136 mmol/L (ref 135–145)

## 2019-05-27 LAB — GLUCOSE, CAPILLARY
Glucose-Capillary: 137 mg/dL — ABNORMAL HIGH (ref 70–99)
Glucose-Capillary: 145 mg/dL — ABNORMAL HIGH (ref 70–99)
Glucose-Capillary: 138 mg/dL — ABNORMAL HIGH (ref 70–99)

## 2019-05-27 MED ORDER — AMIODARONE HCL IN DEXTROSE 360-4.14 MG/200ML-% IV SOLN
30.0000 mg/h | INTRAVENOUS | Status: DC
  Administered 2019-05-27 – 2019-05-28 (×2): 30 mg/h via INTRAVENOUS
  Administered 2019-05-28: 60 mg/h via INTRAVENOUS
  Administered 2019-05-28: 30 mg/h via INTRAVENOUS
  Filled 2019-05-27 (×4): qty 200

## 2019-05-27 MED ORDER — METOLAZONE 5 MG PO TABS
5.0000 mg | ORAL_TABLET | Freq: Once | ORAL | Status: AC
  Administered 2019-05-27: 5 mg via ORAL
  Filled 2019-05-27: qty 1

## 2019-05-27 MED ORDER — AMIODARONE IV BOLUS ONLY 150 MG/100ML
150.0000 mg | Freq: Once | INTRAVENOUS | Status: AC
  Administered 2019-05-27: 150 mg via INTRAVENOUS

## 2019-05-27 MED ORDER — AMIODARONE HCL IN DEXTROSE 360-4.14 MG/200ML-% IV SOLN
60.0000 mg/h | INTRAVENOUS | Status: AC
  Administered 2019-05-27 (×3): 60 mg/h via INTRAVENOUS
  Filled 2019-05-27: qty 200

## 2019-05-27 MED ORDER — AMIODARONE HCL IN DEXTROSE 360-4.14 MG/200ML-% IV SOLN
INTRAVENOUS | Status: AC
  Administered 2019-05-27: 60 mg/h via INTRAVENOUS
  Filled 2019-05-27: qty 200

## 2019-05-27 MED FILL — Heparin Sodium (Porcine) Inj 1000 Unit/ML: INTRAMUSCULAR | Qty: 30 | Status: AC

## 2019-05-27 MED FILL — Magnesium Sulfate Inj 50%: INTRAMUSCULAR | Qty: 10 | Status: AC

## 2019-05-27 MED FILL — Potassium Chloride Inj 2 mEq/ML: INTRAVENOUS | Qty: 40 | Status: AC

## 2019-05-27 NOTE — Progress Notes (Signed)
2 Days Post-Op Procedure(s) (LRB): CORONARY ARTERY BYPASS GRAFTING (CABG) times four using left internal mammary artery and right leg saphenous vein (N/A) TRANSESOPHAGEAL ECHOCARDIOGRAM (TEE) (N/A) Subjective: Sinus brady with good BP coox pending off milrinone Walked in hall 93% on 2 L Objective: Vital signs in last 24 hours: Temp:  [97.6 F (36.4 C)-98.9 F (37.2 C)] 97.6 F (36.4 C) (07/01 0412) Pulse Rate:  [52-88] 58 (07/01 0701) Cardiac Rhythm: Atrial paced (06/30 1600) Resp:  [12-30] 18 (07/01 0701) BP: (97-163)/(50-69) 106/60 (07/01 0701) SpO2:  [88 %-96 %] 96 % (07/01 0701) Arterial Line BP: (107-186)/(41-62) 186/62 (06/30 1600) Weight:  [117.1 kg] 117.1 kg (07/01 0500)  Hemodynamic parameters for last 24 hours:  stable  Intake/Output from previous day: 06/30 0701 - 07/01 0700 In: 754 [P.O.:240; I.V.:413.9; IV Piggyback:100.1] Out: 1285 [Urine:1275; Chest Tube:10] Intake/Output this shift: No intake/output data recorded.       Exam    General- alert and comfortable    Neck- no JVD, no cervical adenopathy palpable, no carotid bruit   Lungs- clear without rales, wheezes   Cor- regular rate and rhythm, no murmur , gallop   Abdomen- soft, non-tender   Extremities - warm, non-tender, minimal edema   Neuro- oriented, appropriate, no focal weakness   Lab Results: Recent Labs    05/26/19 1625 05/27/19 0450  WBC 19.2* 20.4*  HGB 9.4* 8.8*  HCT 28.6* 27.3*  PLT 217 210   BMET:  Recent Labs    05/26/19 1625 05/27/19 0450  NA 136 136  K 4.1 4.3  CL 106 103  CO2 22 26  GLUCOSE 143* 144*  BUN 16 23  CREATININE 1.13 1.52*  CALCIUM 8.5* 8.6*    PT/INR:  Recent Labs    05/25/19 1500  LABPROT 17.4*  INR 1.4*   ABG    Component Value Date/Time   PHART 7.427 05/26/2019 1612   HCO3 23.5 05/26/2019 1612   TCO2 25 05/26/2019 1612   ACIDBASEDEF 1.0 05/26/2019 1612   O2SAT 91.0 05/26/2019 1612   CBG (last 3)  Recent Labs    05/26/19 2104  05/27/19 0107 05/27/19 0408  GLUCAP 138* 137* 145*    Assessment/Plan: S/P Procedure(s) (LRB): CORONARY ARTERY BYPASS GRAFTING (CABG) times four using left internal mammary artery and right leg saphenous vein (N/A) TRANSESOPHAGEAL ECHOCARDIOGRAM (TEE) (N/A) Wt up 10 lbs- cont iv lasix Ready to tx to stepdown if coox ok   LOS: 5 days    Tharon Aquas Trigt III 05/27/2019

## 2019-05-27 NOTE — Progress Notes (Signed)
Progress Note  Patient Name: Tony Holloway Date of Encounter: 05/27/2019  Primary Cardiologist: No primary care provider on file.   Subjective   Doing well.  No shortness of breath.  Expected chest soreness.  No other complaints.  Inpatient Medications    Scheduled Meds: . acetaminophen  1,000 mg Oral Q6H   Or  . acetaminophen (TYLENOL) oral liquid 160 mg/5 mL  1,000 mg Per Tube Q6H  . aspirin EC  325 mg Oral Daily   Or  . aspirin  324 mg Per Tube Daily  . bisacodyl  10 mg Oral Daily   Or  . bisacodyl  10 mg Rectal Daily  . Chlorhexidine Gluconate Cloth  6 each Topical Daily  . Chlorhexidine Gluconate Cloth  6 each Topical Daily  . docusate sodium  200 mg Oral Daily  . enoxaparin (LOVENOX) injection  40 mg Subcutaneous QHS  . furosemide  40 mg Intravenous BID  . loratadine  10 mg Oral Daily  . mouth rinse  15 mL Mouth Rinse BID  . montelukast  10 mg Oral QHS  . pantoprazole  40 mg Oral Daily  . potassium chloride  20 mEq Oral Daily  . pravastatin  20 mg Oral q1800  . sodium chloride flush  10-40 mL Intracatheter Q12H   Continuous Infusions: . sodium chloride Stopped (05/26/19 0835)  . sodium chloride    . sodium chloride 20 mL/hr at 05/25/19 1520  . lactated ringers    . lactated ringers Stopped (05/26/19 1543)   PRN Meds: sodium chloride, cyclobenzaprine, metoprolol tartrate, ondansetron (ZOFRAN) IV, oxyCODONE, sodium chloride flush, traMADol   Vital Signs    Vitals:   05/27/19 0501 05/27/19 0601 05/27/19 0701 05/27/19 0800  BP: (!) 121/54 (!) 163/53 106/60 (!) 131/96  Pulse: (!) 52 74 (!) 58 (!) 54  Resp: 18 (!) 28 18 18   Temp:   98.5 F (36.9 C)   TempSrc:   Oral   SpO2: 93% 91% 96% 95%  Weight:      Height:        Intake/Output Summary (Last 24 hours) at 05/27/2019 0856 Last data filed at 05/27/2019 0810 Gross per 24 hour  Intake 836.69 ml  Output 1245 ml  Net -408.31 ml   Last 3 Weights 05/27/2019 05/26/2019 05/25/2019  Weight (lbs) 258 lb 2.5 oz  260 lb 2.3 oz 246 lb 6.4 oz  Weight (kg) 117.1 kg 118 kg 111.766 kg      Telemetry    Sinus bradycardia heart rate 50 bpm - Personally Reviewed   Physical Exam  Alert, oriented, obese male sitting up in a chair at the bedside GEN: No acute distress.   Neck:  JVP elevated Cardiac:  Bradycardic and regular, no murmurs, rubs, or gallops.  Respiratory:  Diminished in the bases bilaterally GI: Soft, nontender, non-distended  MS:  Mild edema; No deformity. Neuro:  Nonfocal  Psych: Normal affect   Labs    High Sensitivity Troponin:  No results for input(s): TROPONINIHS in the last 720 hours.    Cardiac EnzymesNo results for input(s): TROPONINI in the last 168 hours. No results for input(s): TROPIPOC in the last 168 hours.   Chemistry Recent Labs  Lab 05/23/19 0331  05/26/19 0246  05/26/19 1612 05/26/19 1625 05/27/19 0450  NA 141   < > 137   < > 137 136 136  K 4.7   < > 4.2   < > 4.2 4.1 4.3  CL 108   < >  108  --   --  106 103  CO2 26   < > 23  --   --  22 26  GLUCOSE 102*   < > 123*  --   --  143* 144*  BUN 9   < > 13  --   --  16 23  CREATININE 1.07   < > 0.95  --   --  1.13 1.52*  CALCIUM 8.7*   < > 8.1*  --   --  8.5* 8.6*  PROT 5.2*  --   --   --   --   --   --   ALBUMIN 3.0*  --   --   --   --   --   --   AST 27  --   --   --   --   --   --   ALT 20  --   --   --   --   --   --   ALKPHOS 72  --   --   --   --   --   --   BILITOT 0.6  --   --   --   --   --   --   GFRNONAA >60   < > >60  --   --  >60 45*  GFRAA >60   < > >60  --   --  >60 53*  ANIONGAP 7   < > 6  --   --  8 7   < > = values in this interval not displayed.     Hematology Recent Labs  Lab 05/26/19 0246  05/26/19 1612 05/26/19 1625 05/27/19 0450  WBC 15.7*  --   --  19.2* 20.4*  RBC 3.12*  --   --  3.28* 3.03*  HGB 9.2*   < > 9.9* 9.4* 8.8*  HCT 27.0*   < > 29.0* 28.6* 27.3*  MCV 86.5  --   --  87.2 90.1  MCH 29.5  --   --  28.7 29.0  MCHC 34.1  --   --  32.9 32.2  RDW 13.4  --   --  13.5  13.6  PLT 198  --   --  217 210   < > = values in this interval not displayed.    BNPNo results for input(s): BNP, PROBNP in the last 168 hours.   DDimer No results for input(s): DDIMER in the last 168 hours.   Radiology    Dg Chest Port 1 View  Result Date: 05/26/2019 CLINICAL DATA:  Status post CABG. EXAM: PORTABLE CHEST 1 VIEW COMPARISON:  05/25/2019. FINDINGS: Interim extubation and removal of NG tube. Mediastinal drainage catheter, Swan-Ganz catheter, left chest tube in stable position. Prior CABG. Cardiomegaly. Bibasilar atelectasis/infiltrates. No pleural effusion or pneumothorax. IMPRESSION: 1. Interim extubation removal of NG tube. Mediastinal drainage catheter, Swan-Ganz catheter, left chest tube in stable position. No pneumothorax. 2. Persistent bibasilar atelectasis/infiltrates. No pleural effusion noted on today's exam. 3.  Prior CABG.  Stable cardiomegaly. Electronically Signed   By: Marcello Moores  Register   On: 05/26/2019 07:03   Dg Chest Port 1 View  Result Date: 05/25/2019 CLINICAL DATA:  Status post coronary artery bypass graft. EXAM: PORTABLE CHEST 1 VIEW COMPARISON:  Radiographs of May 23, 2019. FINDINGS: Endotracheal and nasogastric tubes are in grossly good position. Left-sided chest tube is noted without pneumothorax. Right internal jugular Swan-Ganz catheter is noted with distal tip in expected position  of main pulmonary artery. Bibasilar atelectasis and effusions are noted. Bony thorax is unremarkable. IMPRESSION: Endotracheal and nasogastric tubes in good position. Left-sided chest tube is noted without pneumothorax. Bibasilar atelectasis and effusions are noted Electronically Signed   By: Marijo Conception M.D.   On: 05/25/2019 15:16     Patient Profile     73 y.o. 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 He underwent 4V CABG on 05/25/19. LVEF normal by echo.   Assessment & Plan    73 year old male with CAD  and unstable angina: Found to have severe multivessel disease treated with four-vessel CABG, now postoperative day #2.  The patient has postoperative volume overload and is being treated with IV diuresis (furosemide 40 mg IV twice daily).  Telemetry is reviewed and shows sinus bradycardia without further requirement for pacing.  Plans noted to transfer him to a telemetry bed today.  Medical program includes aspirin and pravastatin with history of statin intolerance.  No beta-blocker because of bradycardia.  Will follow along during his hospitalization and arrange outpatient cardiology follow-up.    For questions or updates, please contact [place] [facility] Please consult [email or web address] for contact info under        Signed, Sherren Mocha, MD  05/27/2019, 8:56 AM

## 2019-05-27 NOTE — Progress Notes (Signed)
TCTS Evening Rounds POD 2 s/p CABG Uneventful day Gentle diuresis Mixed venous up to 48% Continue present therapy

## 2019-05-28 ENCOUNTER — Inpatient Hospital Stay (HOSPITAL_COMMUNITY): Payer: PPO

## 2019-05-28 DIAGNOSIS — I4891 Unspecified atrial fibrillation: Secondary | ICD-10-CM

## 2019-05-28 DIAGNOSIS — I517 Cardiomegaly: Secondary | ICD-10-CM

## 2019-05-28 LAB — ECHOCARDIOGRAM LIMITED
Height: 71 in
Weight: 4031.77 oz

## 2019-05-28 LAB — BASIC METABOLIC PANEL
Anion gap: 10 (ref 5–15)
BUN: 27 mg/dL — ABNORMAL HIGH (ref 8–23)
CO2: 27 mmol/L (ref 22–32)
Calcium: 8.3 mg/dL — ABNORMAL LOW (ref 8.9–10.3)
Chloride: 100 mmol/L (ref 98–111)
Creatinine, Ser: 1.27 mg/dL — ABNORMAL HIGH (ref 0.61–1.24)
GFR calc Af Amer: >60 mL/min (ref >60)
GFR calc non Af Amer: 56 mL/min — ABNORMAL LOW (ref >60)
Glucose, Bld: 115 mg/dL — ABNORMAL HIGH (ref 70–99)
Potassium: 3.4 mmol/L — ABNORMAL LOW (ref 3.5–5.1)
Sodium: 137 mmol/L (ref 135–145)

## 2019-05-28 LAB — TYPE AND SCREEN
ABO/RH(D): O POS
Antibody Screen: NEGATIVE
Unit division: 0
Unit division: 0

## 2019-05-28 LAB — BPAM RBC
Blood Product Expiration Date: 202007282359
Blood Product Expiration Date: 202007282359
Unit Type and Rh: 5100
Unit Type and Rh: 5100

## 2019-05-28 LAB — CBC
HCT: 26.9 % — ABNORMAL LOW (ref 39.0–52.0)
Hemoglobin: 8.8 g/dL — ABNORMAL LOW (ref 13.0–17.0)
MCH: 28.9 pg (ref 26.0–34.0)
MCHC: 32.7 g/dL (ref 30.0–36.0)
MCV: 88.2 fL (ref 80.0–100.0)
Platelets: 217 10*3/uL (ref 150–400)
RBC: 3.05 MIL/uL — ABNORMAL LOW (ref 4.22–5.81)
RDW: 13.6 % (ref 11.5–15.5)
WBC: 16.9 10*3/uL — ABNORMAL HIGH (ref 4.0–10.5)
nRBC: 0.1 % (ref 0.0–0.2)

## 2019-05-28 MED ORDER — POTASSIUM CHLORIDE 10 MEQ/50ML IV SOLN
10.0000 meq | INTRAVENOUS | Status: AC
  Administered 2019-05-28 (×3): 10 meq via INTRAVENOUS
  Filled 2019-05-28 (×4): qty 50

## 2019-05-28 MED ORDER — PRAVASTATIN SODIUM 10 MG PO TABS
20.0000 mg | ORAL_TABLET | Freq: Two times a day (BID) | ORAL | Status: DC
  Administered 2019-05-28 – 2019-06-01 (×9): 20 mg via ORAL
  Filled 2019-05-28 (×9): qty 2

## 2019-05-28 MED ORDER — POTASSIUM CHLORIDE CRYS ER 20 MEQ PO TBCR
40.0000 meq | EXTENDED_RELEASE_TABLET | Freq: Two times a day (BID) | ORAL | Status: DC
  Administered 2019-05-28 – 2019-06-01 (×9): 40 meq via ORAL
  Filled 2019-05-28 (×9): qty 2

## 2019-05-28 MED ORDER — METOPROLOL TARTRATE 50 MG PO TABS
50.0000 mg | ORAL_TABLET | Freq: Two times a day (BID) | ORAL | Status: DC
  Administered 2019-05-28: 50 mg via ORAL
  Filled 2019-05-28: qty 1

## 2019-05-28 MED ORDER — METOPROLOL TARTRATE 25 MG PO TABS: 25.0000 mg | ORAL_TABLET | Freq: Two times a day (BID) | ORAL | Status: DC

## 2019-05-28 MED ORDER — FUROSEMIDE 40 MG PO TABS
40.0000 mg | ORAL_TABLET | Freq: Every day | ORAL | Status: DC
  Administered 2019-05-28 – 2019-06-01 (×5): 40 mg via ORAL
  Filled 2019-05-28 (×5): qty 1

## 2019-05-28 MED ORDER — AMIODARONE HCL IN DEXTROSE 360-4.14 MG/200ML-% IV SOLN
60.0000 mg/h | INTRAVENOUS | Status: AC
  Administered 2019-05-28: 60 mg/h via INTRAVENOUS

## 2019-05-28 MED ORDER — METOPROLOL TARTRATE 12.5 MG HALF TABLET
12.5000 mg | ORAL_TABLET | Freq: Two times a day (BID) | ORAL | Status: DC
  Administered 2019-05-29 – 2019-06-01 (×3): 12.5 mg via ORAL
  Filled 2019-05-28 (×6): qty 1

## 2019-05-28 MED FILL — Lidocaine HCl Local Soln Prefilled Syringe 100 MG/5ML (2%): INTRAMUSCULAR | Qty: 10 | Status: AC

## 2019-05-28 MED FILL — Heparin Sodium (Porcine) Inj 1000 Unit/ML: INTRAMUSCULAR | Qty: 30 | Status: AC

## 2019-05-28 MED FILL — Electrolyte-R (PH 7.4) Solution: INTRAVENOUS | Qty: 4000 | Status: AC

## 2019-05-28 MED FILL — Sodium Bicarbonate IV Soln 8.4%: INTRAVENOUS | Qty: 50 | Status: AC

## 2019-05-28 MED FILL — Sodium Chloride IV Soln 0.9%: INTRAVENOUS | Qty: 2000 | Status: AC

## 2019-05-28 MED FILL — Mannitol IV Soln 20%: INTRAVENOUS | Qty: 500 | Status: AC

## 2019-05-28 MED FILL — Heparin Sodium (Porcine) Inj 1000 Unit/ML: INTRAMUSCULAR | Qty: 10 | Status: AC

## 2019-05-28 NOTE — Progress Notes (Signed)
Progress Note  Patient Name: Tony Holloway Date of Encounter: 05/28/2019  Primary Cardiologist: No primary care provider on file.   Subjective   Feeling short of breath, no chest pain. AF with RVR noted overnight.  Inpatient Medications    Scheduled Meds: . acetaminophen  1,000 mg Oral Q6H   Or  . acetaminophen (TYLENOL) oral liquid 160 mg/5 mL  1,000 mg Per Tube Q6H  . aspirin EC  325 mg Oral Daily   Or  . aspirin  324 mg Per Tube Daily  . bisacodyl  10 mg Oral Daily   Or  . bisacodyl  10 mg Rectal Daily  . Chlorhexidine Gluconate Cloth  6 each Topical Daily  . docusate sodium  200 mg Oral Daily  . enoxaparin (LOVENOX) injection  40 mg Subcutaneous QHS  . furosemide  40 mg Oral Daily  . loratadine  10 mg Oral Daily  . mouth rinse  15 mL Mouth Rinse BID  . metoprolol tartrate  50 mg Oral BID  . montelukast  10 mg Oral QHS  . pantoprazole  40 mg Oral Daily  . potassium chloride  40 mEq Oral BID  . pravastatin  20 mg Oral BID  . sodium chloride flush  10-40 mL Intracatheter Q12H   Continuous Infusions: . sodium chloride Stopped (05/26/19 0835)  . sodium chloride    . sodium chloride 20 mL/hr at 05/25/19 1520  . amiodarone 60 mg/hr (05/28/19 0818)  . amiodarone 60 mg/hr (05/28/19 0819)  . potassium chloride 10 mEq (05/28/19 0728)   PRN Meds: sodium chloride, cyclobenzaprine, metoprolol tartrate, ondansetron (ZOFRAN) IV, oxyCODONE, sodium chloride flush, traMADol   Vital Signs    Vitals:   05/28/19 0600 05/28/19 0700 05/28/19 0736 05/28/19 0800  BP: 109/63 120/76  (!) 134/98  Pulse: (!) 134 99  (!) 104  Resp: 16 (!) 22  20  Temp:   99 F (37.2 C)   TempSrc:   Oral   SpO2: 93% 94%  95%  Weight:      Height:        Intake/Output Summary (Last 24 hours) at 05/28/2019 0821 Last data filed at 05/28/2019 0700 Gross per 24 hour  Intake 1242.43 ml  Output 1375 ml  Net -132.57 ml   Last 3 Weights 05/28/2019 05/27/2019 05/26/2019  Weight (lbs) 251 lb 15.8 oz 258 lb  2.5 oz 260 lb 2.3 oz  Weight (kg) 114.3 kg 117.1 kg 118 kg      Telemetry    AV pacing ---> AF with RVR at 0500, currently AF with RVR rate 100-120 bpm - Personally Reviewed   Physical Exam  Alert, oriented, NAD GEN: No acute distress.   Neck: No JVD Cardiac: irregularly irregular, no murmurs, rubs, or gallops.  Respiratory: diminished in bases, otherwise clear to auscultation bilaterally. GI: Soft, nontender, non-distended  MS: 1+ BL pretibial edema; No deformity. Neuro:  Nonfocal  Psych: Normal affect   Labs    High Sensitivity Troponin:  No results for input(s): TROPONINIHS in the last 720 hours.    Cardiac EnzymesNo results for input(s): TROPONINI in the last 168 hours. No results for input(s): TROPIPOC in the last 168 hours.   Chemistry Recent Labs  Lab 05/23/19 0331  05/26/19 1625 05/27/19 0450 05/28/19 0400  NA 141   < > 136 136 137  K 4.7   < > 4.1 4.3 3.4*  CL 108   < > 106 103 100  CO2 26   < >  22 26 27   GLUCOSE 102*   < > 143* 144* 115*  BUN 9   < > 16 23 27*  CREATININE 1.07   < > 1.13 1.52* 1.27*  CALCIUM 8.7*   < > 8.5* 8.6* 8.3*  PROT 5.2*  --   --   --   --   ALBUMIN 3.0*  --   --   --   --   AST 27  --   --   --   --   ALT 20  --   --   --   --   ALKPHOS 72  --   --   --   --   BILITOT 0.6  --   --   --   --   GFRNONAA >60   < > >60 45* 56*  GFRAA >60   < > >60 53* >60  ANIONGAP 7   < > 8 7 10    < > = values in this interval not displayed.     Hematology Recent Labs  Lab 05/26/19 1625 05/27/19 0450 05/28/19 0400  WBC 19.2* 20.4* 16.9*  RBC 3.28* 3.03* 3.05*  HGB 9.4* 8.8* 8.8*  HCT 28.6* 27.3* 26.9*  MCV 87.2 90.1 88.2  MCH 28.7 29.0 28.9  MCHC 32.9 32.2 32.7  RDW 13.5 13.6 13.6  PLT 217 210 217    BNPNo results for input(s): BNP, PROBNP in the last 168 hours.   DDimer No results for input(s): DDIMER in the last 168 hours.   Radiology    Dg Chest Port 1 View  Result Date: 05/27/2019 CLINICAL DATA:  Post CABG EXAM: PORTABLE  CHEST 1 VIEW COMPARISON:  Portable exam 0522 hours compared to 05/26/2019 FINDINGS: Swan-Ganz catheter and mediastinal drain removed. RIGHT jugular central venous catheter tip projecting over SVC. Enlargement of cardiac silhouette post CABG. Mediastinal contours and pulmonary vascularity normal. Bibasilar atelectasis. No pleural effusion or pneumothorax. IMPRESSION: Enlargement of cardiac silhouette post CABG. Bibasilar atelectasis. Electronically Signed   By: Lavonia Dana M.D.   On: 05/27/2019 08:54    Cardiac Studies   Bedside 2D echo, limited imaging, demonstrates normal LV function and no significant pericardial effusion. Formal interpretation pending.  Patient Profile     73 y.o. male postoperative day #3 from multivessel CABG after presenting with unstable angina.  Assessment & Plan    1.  Postoperative atrial fibrillation with RVR: Treated currently with IV amiodarone and oral metoprolol.  Oral metoprolol increased this morning.  Dr. Prescott Gum has ordered a second bolus of amiodarone.  I suspect he will convert with current measures. 2.  CAD status post CABG: Otherwise appears to be progressing well on current medical therapy.  Limited echo images reviewed at the bedside today and demonstrate what appears to be normal LV function. 3.  Postoperative volume overload: Diuresis per surgical team  For questions or updates, please contact [place] Please consult [email or web address] for contact info under     Signed, Sherren Mocha, MD  05/28/2019, 8:21 AM

## 2019-05-28 NOTE — Progress Notes (Signed)
     [address]       [place] [address]             [phone]      No events.  Resting comfortable.   On amio, remains paced  Vitals:   05/28/19 1600 05/28/19 1700  BP: 102/67 114/70  Pulse: 87 89  Resp: 14 (!) 23  Temp:    SpO2: 96%    CBC    Component Value Date/Time   WBC 16.9 (H) 05/28/2019 0400   RBC 3.05 (L) 05/28/2019 0400   HGB 8.8 (L) 05/28/2019 0400   HGB 15.4 05/18/2019 1131   HCT 26.9 (L) 05/28/2019 0400   HCT 45.0 05/18/2019 1131   PLT 217 05/28/2019 0400   PLT 277 05/18/2019 1131   MCV 88.2 05/28/2019 0400   MCV 86 05/18/2019 1131   MCH 28.9 05/28/2019 0400   MCHC 32.7 05/28/2019 0400   RDW 13.6 05/28/2019 0400   RDW 13.4 05/18/2019 1131   BMP Latest Ref Rng & Units 05/28/2019 05/27/2019 05/26/2019  Glucose 70 - 99 mg/dL 115(H) 144(H) 143(H)  BUN 8 - 23 mg/dL 27(H) 23 16  Creatinine 0.61 - 1.24 mg/dL 1.27(H) 1.52(H) 1.13  BUN/Creat Ratio 10 - 24 - - -  Sodium 135 - 145 mmol/L 137 136 136  Potassium 3.5 - 5.1 mmol/L 3.4(L) 4.3 4.1  Chloride 98 - 111 mmol/L 100 103 106  CO2 22 - 32 mmol/L 27 26 22   Calcium 8.9 - 10.3 mg/dL 8.3(L) 8.6(L) 8.5(L)

## 2019-05-28 NOTE — Progress Notes (Signed)
  Echocardiogram 2D Echocardiogram limited has been performed.  Jennette Dubin 05/28/2019, 8:33 AM

## 2019-05-28 NOTE — Progress Notes (Addendum)
      [address]       [place] [address]             [phone]      3 Days Post-Op Procedure(s) (LRB): CORONARY ARTERY BYPASS GRAFTING (CABG) times four using left internal mammary artery and right leg saphenous vein (N/A) TRANSESOPHAGEAL ECHOCARDIOGRAM (TEE) (N/A)   Subjective:  Up in chair, has pain at right wrist, with bruising  Objective: Vital signs in last 24 hours: Temp:  [97.6 F (36.4 C)-99 F (37.2 C)] 99 F (37.2 C) (07/02 0736) Pulse Rate:  [55-139] 99 (07/02 0700) Cardiac Rhythm: Ventricular paced (07/02 0500) Resp:  [13-22] 22 (07/02 0700) BP: (92-148)/(53-130) 120/76 (07/02 0700) SpO2:  [93 %-100 %] 94 % (07/02 0700) Weight:  [114.3 kg] 114.3 kg (07/02 0500)  Intake/Output from previous day: 07/01 0701 - 07/02 0700 In: 1371.5 [I.V.:1244.4; IV Piggyback:127.2] Out: 1375 [PZWCH:8527]  General appearance: alert, cooperative and no distress Heart: irregularly irregular rhythm Lungs: clear to auscultation bilaterally Abdomen: soft, non-tender; bowel sounds normal; no masses,  no organomegaly Extremities: edema 1+ Wound: clean and dry  Lab Results: Recent Labs    05/27/19 0450 05/28/19 0400  WBC 20.4* 16.9*  HGB 8.8* 8.8*  HCT 27.3* 26.9*  PLT 210 217   BMET:  Recent Labs    05/27/19 0450 05/28/19 0400  NA 136 137  K 4.3 3.4*  CL 103 100  CO2 26 27  GLUCOSE 144* 115*  BUN 23 27*  CREATININE 1.52* 1.27*  CALCIUM 8.6* 8.3*    PT/INR:  Recent Labs    05/25/19 1500  LABPROT 17.4*  INR 1.4*   ABG    Component Value Date/Time   PHART 7.427 05/26/2019 1612   HCO3 23.5 05/26/2019 1612   TCO2 25 05/26/2019 1612   ACIDBASEDEF 1.0 05/26/2019 1612   O2SAT 48.3 05/27/2019 1442   CBG (last 3)  Recent Labs    05/27/19 0107 05/27/19 0408 05/27/19 0758  GLUCAP 137* 145* 138*    Assessment/Plan: S/P Procedure(s) (LRB): CORONARY ARTERY BYPASS GRAFTING (CABG) times four using left internal mammary artery and right  leg saphenous vein (N/A) TRANSESOPHAGEAL ECHOCARDIOGRAM (TEE) (N/A)  1. CV- Atrial Fibrillation, on IV Amiodarone, will increase Lopressor to 50 mg BID.Marland Kitchen if doesn't convert may benefit from repeat bolus 2. Pulm- no acute issues, continue IS, wean oxygen as tolerated 3. Renal- creatinine improved at 1.27, good diureses yesterday, will transition to oral regimen of Lasix as weight is up about 3 lbs 4. Hypokalemia- K is at 3.4 5. Expected post operative blood loss anemia, stable hgb at 8.8 6. Dispo- patient stable, remains in A. Fib will increase Lopressor, transition to oral regimen of lasix, supplement K, hgb remains stable, will stay in ICU until A. Fib is under better controlled   LOS: 6 days    Erin Barrett PA-C  05/28/2019  back in sinus with low HR 42 Reduce iv amio to 30, reduce po lopressor to 12.5 Paced at 88  patient examined and medical record reviewed,agree with above note. Tharon Aquas Trigt III 05/28/2019

## 2019-05-29 ENCOUNTER — Encounter (HOSPITAL_COMMUNITY): Payer: Self-pay | Admitting: Cardiology

## 2019-05-29 ENCOUNTER — Inpatient Hospital Stay (HOSPITAL_COMMUNITY): Payer: PPO

## 2019-05-29 DIAGNOSIS — I4891 Unspecified atrial fibrillation: Secondary | ICD-10-CM | POA: Diagnosis present

## 2019-05-29 DIAGNOSIS — Z951 Presence of aortocoronary bypass graft: Secondary | ICD-10-CM

## 2019-05-29 DIAGNOSIS — R001 Bradycardia, unspecified: Secondary | ICD-10-CM | POA: Diagnosis not present

## 2019-05-29 DIAGNOSIS — I9789 Other postprocedural complications and disorders of the circulatory system, not elsewhere classified: Secondary | ICD-10-CM

## 2019-05-29 HISTORY — DX: Other postprocedural complications and disorders of the circulatory system, not elsewhere classified: I97.89

## 2019-05-29 HISTORY — DX: Unspecified atrial fibrillation: I48.91

## 2019-05-29 LAB — BASIC METABOLIC PANEL
Anion gap: 10 (ref 5–15)
BUN: 29 mg/dL — ABNORMAL HIGH (ref 8–23)
CO2: 29 mmol/L (ref 22–32)
Calcium: 8.4 mg/dL — ABNORMAL LOW (ref 8.9–10.3)
Chloride: 97 mmol/L — ABNORMAL LOW (ref 98–111)
Creatinine, Ser: 1.13 mg/dL (ref 0.61–1.24)
GFR calc Af Amer: >60 mL/min (ref >60)
GFR calc non Af Amer: >60 mL/min (ref >60)
Glucose, Bld: 111 mg/dL — ABNORMAL HIGH (ref 70–99)
Potassium: 3.5 mmol/L (ref 3.5–5.1)
Sodium: 136 mmol/L (ref 135–145)

## 2019-05-29 LAB — CBC
HCT: 26.4 % — ABNORMAL LOW (ref 39.0–52.0)
Hemoglobin: 8.6 g/dL — ABNORMAL LOW (ref 13.0–17.0)
MCH: 29.3 pg (ref 26.0–34.0)
MCHC: 32.6 g/dL (ref 30.0–36.0)
MCV: 89.8 fL (ref 80.0–100.0)
Platelets: 270 10*3/uL (ref 150–400)
RBC: 2.94 MIL/uL — ABNORMAL LOW (ref 4.22–5.81)
RDW: 13.6 % (ref 11.5–15.5)
WBC: 14.8 10*3/uL — ABNORMAL HIGH (ref 4.0–10.5)
nRBC: 0.4 % — ABNORMAL HIGH (ref 0.0–0.2)

## 2019-05-29 MED ORDER — AMIODARONE HCL 200 MG PO TABS
200.0000 mg | ORAL_TABLET | Freq: Two times a day (BID) | ORAL | Status: DC
  Administered 2019-05-29 – 2019-06-01 (×7): 200 mg via ORAL
  Filled 2019-05-29 (×7): qty 1

## 2019-05-29 NOTE — Progress Notes (Signed)
Progress Note  Patient Name: Tony Holloway Date of Encounter: 05/29/2019  Primary Cardiologist: No primary care provider on file.   Subjective   Feels great.  The most thing that bothers him is his right wrist at the attempted cath site. Otherwise denies any dyspnea or any discomfort in his chest.  Inpatient Medications    Scheduled Meds: . acetaminophen  1,000 mg Oral Q6H   Or  . acetaminophen (TYLENOL) oral liquid 160 mg/5 mL  1,000 mg Per Tube Q6H  . amiodarone  200 mg Oral BID  . aspirin EC  325 mg Oral Daily   Or  . aspirin  324 mg Per Tube Daily  . bisacodyl  10 mg Oral Daily   Or  . bisacodyl  10 mg Rectal Daily  . Chlorhexidine Gluconate Cloth  6 each Topical Daily  . docusate sodium  200 mg Oral Daily  . enoxaparin (LOVENOX) injection  40 mg Subcutaneous QHS  . furosemide  40 mg Oral Daily  . loratadine  10 mg Oral Daily  . mouth rinse  15 mL Mouth Rinse BID  . metoprolol tartrate  12.5 mg Oral BID  . montelukast  10 mg Oral QHS  . pantoprazole  40 mg Oral Daily  . potassium chloride  40 mEq Oral BID  . pravastatin  20 mg Oral BID  . sodium chloride flush  10-40 mL Intracatheter Q12H   Continuous Infusions: . sodium chloride Stopped (05/26/19 0835)  . sodium chloride    . sodium chloride 20 mL/hr at 05/25/19 1520   PRN Meds: sodium chloride, cyclobenzaprine, metoprolol tartrate, ondansetron (ZOFRAN) IV, oxyCODONE, sodium chloride flush, traMADol   Vital Signs    Vitals:   05/29/19 0400 05/29/19 0500 05/29/19 0600 05/29/19 0700  BP: 117/73 117/65 117/73   Pulse: 87 87 87 86  Resp: 14 17 15 17   Temp: 98.6 F (37 C)   98.5 F (36.9 C)  TempSrc: Oral   Oral  SpO2: 96% 92% 98% 96%  Weight:  115 kg    Height:        Intake/Output Summary (Last 24 hours) at 05/29/2019 1020 Last data filed at 05/29/2019 0700 Gross per 24 hour  Intake 738.03 ml  Output 1200 ml  Net -461.97 ml   Last 3 Weights 05/29/2019 05/28/2019 05/27/2019  Weight (lbs) 253 lb 8.5 oz  251 lb 15.8 oz 258 lb 2.5 oz  Weight (kg) 115 kg 114.3 kg 117.1 kg      Telemetry    Now appears to have a-V pacing with intermittent sinus bradycardia.- Personally Reviewed   Physical Exam   Physical Exam  Constitutional: He is oriented to person, place, and time. He appears well-developed and well-nourished. No distress.  Obese gentleman.  Well-groomed  HENT:  Head: Normocephalic and atraumatic.  Mouth/Throat: Oropharynx is clear and moist.  Eyes: EOM are normal.  Neck: Normal range of motion. Neck supple. No JVD present.  Right IJ line dressing in place  Cardiovascular: Normal rate and regular rhythm.  No extrasystoles are present. PMI is not displaced (Cannot palpate). Exam reveals distant heart sounds, friction rub (Soft two-part rub) and decreased pulses (Decreased pedal pulses bilaterally because of mild edema.). Exam reveals no gallop.  No murmur heard. Very muffled S1 and S2.  Pulmonary/Chest: Effort normal. No respiratory distress. He has no rales. He exhibits tenderness (Not unexpected chest wall tenderness with well-healed sternotomy scar).  Mildly diminished basal breath sounds, but no obvious rales or rhonchi.  Abdominal: Soft. Bowel sounds are normal. He exhibits no distension. There is no abdominal tenderness. There is no rebound.  Musculoskeletal: Normal range of motion.        General: Edema (Trace to 1+ bilateral pedal edema.) present.  Neurological: He is alert and oriented to person, place, and time.  Skin: He is not diaphoretic.  Ecchymosis along the right forearm  Psychiatric: He has a normal mood and affect. His behavior is normal. Judgment and thought content normal.  Nursing note and vitals reviewed.   Labs    High Sensitivity Troponin:  No results for input(s): TROPONINIHS in the last 720 hours.    Cardiac EnzymesNo results for input(s): TROPONINI in the last 168 hours. No results for input(s): TROPIPOC in the last 168 hours.   Chemistry Recent  Labs  Lab 05/23/19 0331  05/27/19 0450 05/28/19 0400 05/29/19 0330  NA 141   < > 136 137 136  K 4.7   < > 4.3 3.4* 3.5  CL 108   < > 103 100 97*  CO2 26   < > 26 27 29   GLUCOSE 102*   < > 144* 115* 111*  BUN 9   < > 23 27* 29*  CREATININE 1.07   < > 1.52* 1.27* 1.13  CALCIUM 8.7*   < > 8.6* 8.3* 8.4*  PROT 5.2*  --   --   --   --   ALBUMIN 3.0*  --   --   --   --   AST 27  --   --   --   --   ALT 20  --   --   --   --   ALKPHOS 72  --   --   --   --   BILITOT 0.6  --   --   --   --   GFRNONAA >60   < > 45* 56* >60  GFRAA >60   < > 53* >60 >60  ANIONGAP 7   < > 7 10 10    < > = values in this interval not displayed.     Hematology Recent Labs  Lab 05/27/19 0450 05/28/19 0400 05/29/19 0330  WBC 20.4* 16.9* 14.8*  RBC 3.03* 3.05* 2.94*  HGB 8.8* 8.8* 8.6*  HCT 27.3* 26.9* 26.4*  MCV 90.1 88.2 89.8  MCH 29.0 28.9 29.3  MCHC 32.2 32.7 32.6  RDW 13.6 13.6 13.6  PLT 210 217 270    BNPNo results for input(s): BNP, PROBNP in the last 168 hours.   DDimer No results for input(s): DDIMER in the last 168 hours.   Radiology    Dg Chest 2 View  Result Date: 05/28/2019 CLINICAL DATA:  Coronary artery disease. Status post CABG on 05/25/2019. EXAM: CHEST - 2 VIEW COMPARISON:  Chest x-rays dated 05/27/2019, 05/26/2019 and 05/18/2019 FINDINGS: Sheath remains in the superior vena cava. Persistent prominence of the cardiac silhouette. Pulmonary vascularity is normal. No pneumothorax. Tiny right effusion. Small left effusion. Improved aeration at both lung bases. IMPRESSION: 1. Tiny right and small left pleural effusions. 2. Improved aeration of both lung bases. Electronically Signed   By: Lorriane Shire M.D.   On: 05/28/2019 11:02   Dg Chest Port 1 View  Result Date: 05/29/2019 CLINICAL DATA:  Coronary bypass EXAM: PORTABLE CHEST 1 VIEW COMPARISON:  05/28/2019 FINDINGS: Coronary bypass changes noted.  Right IJ vascular sheath remains. Low lung volumes persist with vascular congestion,  basilar atelectasis and likely small bilateral pleural effusions.  No pneumothorax. No significant change in aeration. IMPRESSION: Stable cardiomegaly, vascular congestion and basilar atelectasis. Trace pleural effusions. No pneumothorax Electronically Signed   By: Jerilynn Mages.  Shick M.D.   On: 05/29/2019 09:50    Cardiac Studies    2D Echo 05/28/2019: Limited echo images.  LV appears to be mildly hypertrophic with preserved EF roughly 55%.  There does appear to be some inferior inferoseptal hypokinesis.  Trivial pleural effusion.  Mitral and aortic valves seem to appear normal.  Intra-Op 2D Echo 05/22/2019: EF 55 to 60%.  No obvious regional wall motion normalities.  Pre-op Cath May 22, 2019: ostLM 80%. pLAD 30% & mLAD 50%, p-mCx 40% - OM1 90%, OM2 95%; pRCA 95% (ulcerated plaque).  EF roughly 50% with small focal inferior hypokinesis. -->  Urgent CVTS consult  CABG x 4 05/25/2019 ( Dr. Darcey Nora): LIMA-LAD, SVG-RI (OM1), SVG-LPL, SVG-rPDA)  Patient Profile     73 y.o. male postoperative day #3 from multivessel CABG after presenting with unstable angina. Postop course been complicated by A. fib with RVR --> converted to sinus bradycardia on IV amiodarone.  Assessment & Plan    1.  Postoperative atrial fibrillation with RVR: Converted to sinus bradycardia, significant enough to have backup pacing.  Beta-blocker reduced.    Converted to oral amiodarone low-dose load.  Per CT surgery, not DOAC candidate at this time.  Has discomfort at the right radial cath site: Would recommend ice pack and continue analgesics 2.  CAD status post CABG: Otherwise appears to be progressing well on current medical therapy.  Limited echo images reviewed at the bedside today and demonstrate what appears to be normal LV function.  Remains on aspirin and low-dose pravastatin (has history of statin intolerance)  Blood pressure is somewhat labile but adequately controlled. -Tolerating low-dose Lopressor 3.  Postoperative  volume overload: Diuresis per surgical team with potassium supplementation  Anticipate that he would likely stay in the ICU today due to backup pacing.  If remains stable over the weekend, cardiology will follow at a distance.  Will be available to review medications prior to discharge.  For questions or updates, please contact [place] Please consult [email or web address] for contact info under     Signed, Glenetta Hew, MD  05/29/2019, 10:20 AM

## 2019-05-29 NOTE — Progress Notes (Signed)
Spoke with RN Levada Dy and she stated she was aware of the DC central line order

## 2019-05-29 NOTE — Progress Notes (Signed)
      [address]       [place] [address]             [phone]      Up in chair  Paced rhythm  BP (!) 101/59   Pulse 70   Temp 98.3 F (36.8 C) (Oral)   Resp 18   Ht 5\' 11"  (1.803 m)   Wt 115 kg   SpO2 99%   BMI 35.36 kg/m   Intake/Output Summary (Last 24 hours) at 05/29/2019 1643 Last data filed at 05/29/2019 1400 Gross per 24 hour  Intake 688.84 ml  Output 2225 ml  Net -1536.16 ml   Stable day. Transfer when underlying rate improves  Remo Lipps C. Roxan Hockey, MD [facility] [phone]

## 2019-05-29 NOTE — Progress Notes (Addendum)
      [address]       [place] [address]             [phone]      4 Days Post-Op Procedure(s) (LRB): CORONARY ARTERY BYPASS GRAFTING (CABG) times four using left internal mammary artery and right leg saphenous vein (N/A) TRANSESOPHAGEAL ECHOCARDIOGRAM (TEE) (N/A)   Subjective:  Up in chair, no specific complaints.  He walked well yesterday.  + BM  Objective: Vital signs in last 24 hours: Temp:  [98 F (36.7 C)-98.9 F (37.2 C)] 98.5 F (36.9 C) (07/03 0700) Pulse Rate:  [86-89] 86 (07/03 0700) Cardiac Rhythm: Ventricular paced;Atrial fibrillation (07/03 0400) Resp:  [11-25] 17 (07/03 0700) BP: (98-134)/(65-81) 117/73 (07/03 0600) SpO2:  [87 %-98 %] 96 % (07/03 0700) Weight:  [924 kg] 115 kg (07/03 0500)  Intake/Output from previous day: 07/02 0701 - 07/03 0700 In: 978 [P.O.:240; I.V.:738] Out: 1200 [Urine:1200]  General appearance: alert, cooperative and no distress Heart: regular rate and rhythm Lungs: clear to auscultation bilaterally Abdomen: soft, non-tender; bowel sounds normal; no masses,  no organomegaly Extremities: edema trace Wound: clean and dry  Lab Results: Recent Labs    05/28/19 0400 05/29/19 0330  WBC 16.9* 14.8*  HGB 8.8* 8.6*  HCT 26.9* 26.4*  PLT 217 270   BMET:  Recent Labs    05/28/19 0400 05/29/19 0330  NA 137 136  K 3.4* 3.5  CL 100 97*  CO2 27 29  GLUCOSE 115* 111*  BUN 27* 29*  CREATININE 1.27* 1.13  CALCIUM 8.3* 8.4*    PT/INR: No results for input(s): LABPROT, INR in the last 72 hours. ABG    Component Value Date/Time   PHART 7.427 05/26/2019 1612   HCO3 23.5 05/26/2019 1612   TCO2 25 05/26/2019 1612   ACIDBASEDEF 1.0 05/26/2019 1612   O2SAT 48.3 05/27/2019 1442   CBG (last 3)  Recent Labs    05/27/19 0107 05/27/19 0408 05/27/19 0758  GLUCAP 137* 145* 138*    Assessment/Plan: S/P Procedure(s) (LRB): CORONARY ARTERY BYPASS GRAFTING (CABG) times four using left internal mammary  artery and right leg saphenous vein (N/A) TRANSESOPHAGEAL ECHOCARDIOGRAM (TEE) (N/A)  1. CV- PAF, currently Sinus Brady under pacer- on Lopressor 12.5 mg BID, will stop IV Amiodarone, start oral Amiodarone at 200 mg BID 2. Pulm- no acute issues, continue IS 3. Renal- creatinine remains stable, continue Lasix, repeat K supplement today 4. Expected post operative blood loss anemia, stable 5. Dispo- patient stable, in Sinus Brady under pacer- transition to oral Amiodarone, continue diuretics, IS.Marland Kitchen patient to stay in ICU as he is requiring pacing   LOS: 7 days    Ellwood Handler 05/29/2019  titrating meds for postop afib Severe sinus brady under pacer so will leave in ICU Not planing postop NOAC at this point  patient examined and medical record reviewed,agree with above note. Tharon Aquas Trigt III 05/29/2019

## 2019-05-30 LAB — CBC
HCT: 27.5 % — ABNORMAL LOW (ref 39.0–52.0)
Hemoglobin: 9.3 g/dL — ABNORMAL LOW (ref 13.0–17.0)
MCH: 29.2 pg (ref 26.0–34.0)
MCHC: 33.8 g/dL (ref 30.0–36.0)
MCV: 86.5 fL (ref 80.0–100.0)
Platelets: 328 10*3/uL (ref 150–400)
RBC: 3.18 MIL/uL — ABNORMAL LOW (ref 4.22–5.81)
RDW: 13.2 % (ref 11.5–15.5)
WBC: 12.1 10*3/uL — ABNORMAL HIGH (ref 4.0–10.5)
nRBC: 0.5 % — ABNORMAL HIGH (ref 0.0–0.2)

## 2019-05-30 LAB — BASIC METABOLIC PANEL
Anion gap: 11 (ref 5–15)
BUN: 26 mg/dL — ABNORMAL HIGH (ref 8–23)
CO2: 27 mmol/L (ref 22–32)
Calcium: 8.5 mg/dL — ABNORMAL LOW (ref 8.9–10.3)
Chloride: 97 mmol/L — ABNORMAL LOW (ref 98–111)
Creatinine, Ser: 1.12 mg/dL (ref 0.61–1.24)
GFR calc Af Amer: >60 mL/min (ref >60)
GFR calc non Af Amer: >60 mL/min (ref >60)
Glucose, Bld: 112 mg/dL — ABNORMAL HIGH (ref 70–99)
Potassium: 3.4 mmol/L — ABNORMAL LOW (ref 3.5–5.1)
Sodium: 135 mmol/L (ref 135–145)

## 2019-05-30 MED ORDER — CHLORHEXIDINE GLUCONATE CLOTH 2 % EX PADS
6.0000 | MEDICATED_PAD | Freq: Every day | CUTANEOUS | Status: DC
  Administered 2019-05-30 – 2019-05-31 (×2): 6 via TOPICAL

## 2019-05-30 NOTE — Progress Notes (Signed)
5 Days Post-Op Procedure(s) (LRB): CORONARY ARTERY BYPASS GRAFTING (CABG) times four using left internal mammary artery and right leg saphenous vein (N/A) TRANSESOPHAGEAL ECHOCARDIOGRAM (TEE) (N/A) Subjective: Feels better this AM  Objective: Vital signs in last 24 hours: Temp:  [98.1 F (36.7 C)-98.3 F (36.8 C)] 98.1 F (36.7 C) (07/04 0400) Pulse Rate:  [67-72] 67 (07/04 0700) Cardiac Rhythm: A-V Sequential paced (07/04 0000) Resp:  [11-19] 17 (07/04 0700) BP: (101-140)/(53-93) 117/65 (07/04 0700) SpO2:  [90 %-100 %] 93 % (07/04 0700)  Hemodynamic parameters for last 24 hours:    Intake/Output from previous day: 07/03 0701 - 07/04 0700 In: 1113.3 [P.O.:1080; I.V.:33.3] Out: 2375 [Urine:2375] Intake/Output this shift: Total I/O In: 240 [P.O.:240] Out: -   General appearance: alert, cooperative and no distress Neurologic: intact Heart: regular rate and rhythm Lungs: diminished breath sounds bibasilar Wound: clean and dry  Lab Results: Recent Labs    05/29/19 0330 05/30/19 0308  WBC 14.8* 12.1*  HGB 8.6* 9.3*  HCT 26.4* 27.5*  PLT 270 328   BMET:  Recent Labs    05/29/19 0330 05/30/19 0308  NA 136 135  K 3.5 3.4*  CL 97* 97*  CO2 29 27  GLUCOSE 111* 112*  BUN 29* 26*  CREATININE 1.13 1.12  CALCIUM 8.4* 8.5*    PT/INR: No results for input(s): LABPROT, INR in the last 72 hours. ABG    Component Value Date/Time   PHART 7.427 05/26/2019 1612   HCO3 23.5 05/26/2019 1612   TCO2 25 05/26/2019 1612   ACIDBASEDEF 1.0 05/26/2019 1612   O2SAT 48.3 05/27/2019 1442   CBG (last 3)  No results for input(s): GLUCAP in the last 72 hours.  Assessment/Plan: S/P Procedure(s) (LRB): CORONARY ARTERY BYPASS GRAFTING (CABG) times four using left internal mammary artery and right leg saphenous vein (N/A) TRANSESOPHAGEAL ECHOCARDIOGRAM (TEE) (N/A) -CV- now in SR at 72- will change pacer to VVI at 60 RESP_ continue IS for atelectasis  97% sat on RA RENAL-  creatinine OK- hypokalemia- supplement  Diuresed well yesterday Continue cardiac rehab   LOS: 8 days    Melrose Nakayama 05/30/2019

## 2019-05-30 NOTE — Progress Notes (Signed)
      [address]       [place] [address]             [phone]      No new issues today In SR @ 68, minimal pacing during day BP 140/62   Pulse 69   Temp 98.4 F (36.9 C) (Oral)   Resp 19   Ht 5\' 11"  (1.803 m)   Wt 115 kg   SpO2 96%   BMI 35.36 kg/m   Intake/Output Summary (Last 24 hours) at 05/30/2019 1822 Last data filed at 05/30/2019 1200 Gross per 24 hour  Intake 960 ml  Output 950 ml  Net 10 ml   Remo Lipps C. Roxan Hockey, MD [facility] [phone]

## 2019-05-31 ENCOUNTER — Inpatient Hospital Stay (HOSPITAL_COMMUNITY): Payer: PPO

## 2019-05-31 LAB — BASIC METABOLIC PANEL
Anion gap: 12 (ref 5–15)
BUN: 21 mg/dL (ref 8–23)
CO2: 29 mmol/L (ref 22–32)
Calcium: 8.6 mg/dL — ABNORMAL LOW (ref 8.9–10.3)
Chloride: 95 mmol/L — ABNORMAL LOW (ref 98–111)
Creatinine, Ser: 1.14 mg/dL (ref 0.61–1.24)
GFR calc Af Amer: >60 mL/min (ref >60)
GFR calc non Af Amer: >60 mL/min (ref >60)
Glucose, Bld: 99 mg/dL (ref 70–99)
Potassium: 3.3 mmol/L — ABNORMAL LOW (ref 3.5–5.1)
Sodium: 136 mmol/L (ref 135–145)

## 2019-05-31 LAB — CBC
HCT: 28.1 % — ABNORMAL LOW (ref 39.0–52.0)
Hemoglobin: 9.3 g/dL — ABNORMAL LOW (ref 13.0–17.0)
MCH: 29.2 pg (ref 26.0–34.0)
MCHC: 33.1 g/dL (ref 30.0–36.0)
MCV: 88.1 fL (ref 80.0–100.0)
Platelets: 395 10*3/uL (ref 150–400)
RBC: 3.19 MIL/uL — ABNORMAL LOW (ref 4.22–5.81)
RDW: 13.8 % (ref 11.5–15.5)
WBC: 11.2 10*3/uL — ABNORMAL HIGH (ref 4.0–10.5)
nRBC: 0.6 % — ABNORMAL HIGH (ref 0.0–0.2)

## 2019-05-31 MED ORDER — MOVING RIGHT ALONG BOOK
Freq: Once | Status: DC
  Filled 2019-05-31: qty 1

## 2019-05-31 MED ORDER — POTASSIUM CHLORIDE CRYS ER 20 MEQ PO TBCR
40.0000 meq | EXTENDED_RELEASE_TABLET | Freq: Once | ORAL | Status: AC
  Administered 2019-05-31: 40 meq via ORAL
  Filled 2019-05-31: qty 2

## 2019-05-31 MED ORDER — MAGNESIUM HYDROXIDE 400 MG/5ML PO SUSP: 30.0000 mL | Freq: Every day | ORAL | Status: DC | PRN

## 2019-05-31 MED ORDER — SODIUM CHLORIDE 0.9% FLUSH
3.0000 mL | Freq: Two times a day (BID) | INTRAVENOUS | Status: DC
  Administered 2019-05-31 – 2019-06-01 (×2): 3 mL via INTRAVENOUS

## 2019-05-31 MED ORDER — SODIUM CHLORIDE 0.9% FLUSH: 3.0000 mL | INTRAVENOUS | Status: DC | PRN

## 2019-05-31 MED ORDER — SODIUM CHLORIDE 0.9 % IV SOLN: 250.0000 mL | INTRAVENOUS | Status: DC | PRN

## 2019-05-31 MED ORDER — ALUM & MAG HYDROXIDE-SIMETH 200-200-20 MG/5ML PO SUSP: 15.0000 mL | Freq: Four times a day (QID) | ORAL | Status: DC | PRN

## 2019-05-31 NOTE — Progress Notes (Signed)
6 Days Post-Op Procedure(s) (LRB): CORONARY ARTERY BYPASS GRAFTING (CABG) times four using left internal mammary artery and right leg saphenous vein (N/A) TRANSESOPHAGEAL ECHOCARDIOGRAM (TEE) (N/A) Subjective: C/o burning pain in right foot when he first starts to walk  Objective: Vital signs in last 24 hours: Temp:  [98.2 F (36.8 C)-98.9 F (37.2 C)] 98.9 F (37.2 C) (07/05 0812) Pulse Rate:  [58-69] 66 (07/05 0600) Cardiac Rhythm: Sinus bradycardia;Ventricular paced (07/05 0400) Resp:  [15-22] 22 (07/05 0600) BP: (98-140)/(52-98) 120/62 (07/05 0600) SpO2:  [90 %-99 %] 95 % (07/05 0600) Weight:  [112.7 kg] 112.7 kg (07/05 0530)  Hemodynamic parameters for last 24 hours:    Intake/Output from previous day: 07/04 0701 - 07/05 0700 In: 1522 [P.O.:1522] Out: 600 [Urine:600] Intake/Output this shift: No intake/output data recorded.  General appearance: alert, cooperative and no distress Neurologic: intact Heart: regular rate and rhythm Lungs: diminished breath sounds bibasilar and L>R Extremities: pulses and sensation intact Wound: clean and dry  Lab Results: Recent Labs    05/30/19 0308 05/31/19 0318  WBC 12.1* 11.2*  HGB 9.3* 9.3*  HCT 27.5* 28.1*  PLT 328 395   BMET:  Recent Labs    05/30/19 0308 05/31/19 0318  NA 135 136  K 3.4* 3.3*  CL 97* 95*  CO2 27 29  GLUCOSE 112* 99  BUN 26* 21  CREATININE 1.12 1.14  CALCIUM 8.5* 8.6*    PT/INR: No results for input(s): LABPROT, INR in the last 72 hours. ABG    Component Value Date/Time   PHART 7.427 05/26/2019 1612   HCO3 23.5 05/26/2019 1612   TCO2 25 05/26/2019 1612   ACIDBASEDEF 1.0 05/26/2019 1612   O2SAT 48.3 05/27/2019 1442   CBG (last 3)  No results for input(s): GLUCAP in the last 72 hours.  Assessment/Plan: S/P Procedure(s) (LRB): CORONARY ARTERY BYPASS GRAFTING (CABG) times four using left internal mammary artery and right leg saphenous vein (N/A) TRANSESOPHAGEAL ECHOCARDIOGRAM (TEE)  (N/A) Plan for transfer to step-down: see transfer orders  CV- in SR, some pacing overnight with VVi at 60  Will decrease pacer to 50 bpm RESP- LLL atelectasis v effusion on CXR this AM  PA and Lateral tomorrow RENAL- creatinine OK  Supplement K for hypokalemia Anemia secondary to ABL- mild, follow R foot pain- c/w neuropathic pain, likely secondary to saphenous harvest. Neuro and vascular intact, follow    LOS: 9 days    Melrose Nakayama 05/31/2019

## 2019-06-01 ENCOUNTER — Inpatient Hospital Stay (HOSPITAL_COMMUNITY): Payer: PPO

## 2019-06-01 LAB — CBC
HCT: 28.3 % — ABNORMAL LOW (ref 39.0–52.0)
Hemoglobin: 9.2 g/dL — ABNORMAL LOW (ref 13.0–17.0)
MCH: 29.1 pg (ref 26.0–34.0)
MCHC: 32.5 g/dL (ref 30.0–36.0)
MCV: 89.6 fL (ref 80.0–100.0)
Platelets: 381 10*3/uL (ref 150–400)
RBC: 3.16 MIL/uL — ABNORMAL LOW (ref 4.22–5.81)
RDW: 14.3 % (ref 11.5–15.5)
WBC: 10.3 10*3/uL (ref 4.0–10.5)
nRBC: 0.5 % — ABNORMAL HIGH (ref 0.0–0.2)

## 2019-06-01 LAB — BASIC METABOLIC PANEL
Anion gap: 9 (ref 5–15)
BUN: 17 mg/dL (ref 8–23)
CO2: 29 mmol/L (ref 22–32)
Calcium: 8.5 mg/dL — ABNORMAL LOW (ref 8.9–10.3)
Chloride: 98 mmol/L (ref 98–111)
Creatinine, Ser: 1.19 mg/dL (ref 0.61–1.24)
GFR calc Af Amer: >60 mL/min (ref >60)
GFR calc non Af Amer: >60 mL/min (ref >60)
Glucose, Bld: 100 mg/dL — ABNORMAL HIGH (ref 70–99)
Potassium: 3.6 mmol/L (ref 3.5–5.1)
Sodium: 136 mmol/L (ref 135–145)

## 2019-06-01 MED ORDER — POTASSIUM CHLORIDE CRYS ER 20 MEQ PO TBCR: 40.0000 meq | EXTENDED_RELEASE_TABLET | Freq: Every day | ORAL | 0 refills | Status: AC

## 2019-06-01 MED ORDER — TRAMADOL HCL 50 MG PO TABS: 50.0000 mg | ORAL_TABLET | ORAL | 0 refills | Status: DC | PRN

## 2019-06-01 MED ORDER — FUROSEMIDE 40 MG PO TABS: 40.0000 mg | ORAL_TABLET | Freq: Every day | ORAL | 0 refills | Status: AC

## 2019-06-01 MED ORDER — AMIODARONE HCL 200 MG PO TABS: 200.0000 mg | ORAL_TABLET | Freq: Two times a day (BID) | ORAL | 1 refills | Status: AC

## 2019-06-01 MED ORDER — PRAVASTATIN SODIUM 20 MG PO TABS: 20.0000 mg | ORAL_TABLET | Freq: Two times a day (BID) | ORAL | 3 refills | Status: DC

## 2019-06-01 MED ORDER — CYCLOBENZAPRINE HCL 10 MG PO TABS: 10.0000 mg | ORAL_TABLET | Freq: Three times a day (TID) | ORAL | 0 refills | Status: AC | PRN

## 2019-06-01 NOTE — Care Management Important Message (Signed)
Important Message  Patient Details  Name: Tony Holloway MRN: [id number] Date of Birth: May 18, 1946   Medicare Important Message Given:  Yes     Shelda Altes 06/01/2019, 1:21 PM

## 2019-06-01 NOTE — Progress Notes (Addendum)
      [address]       [place] [address]             [phone]        7 Days Post-Op Procedure(s) (LRB): CORONARY ARTERY BYPASS GRAFTING (CABG) times four using left internal mammary artery and right leg saphenous vein (N/A) TRANSESOPHAGEAL ECHOCARDIOGRAM (TEE) (N/A)  Subjective: Patient without specific complaints this am.  Objective: Vital signs in last 24 hours: Temp:  [98 F (36.7 C)-98.5 F (36.9 C)] 98.2 F (36.8 C) (07/06 0540) Pulse Rate:  [68-77] 72 (07/06 0540) Cardiac Rhythm: Normal sinus rhythm (07/06 0700) Resp:  [14-20] 19 (07/06 0540) BP: (113-145)/(55-83) 124/76 (07/06 0540) SpO2:  [92 %-98 %] 97 % (07/06 0540) Weight:  [112.3 kg] 112.3 kg (07/06 0540)  Pre op weight 111.8 kg Current Weight  06/01/19 112.3 kg       Intake/Output from previous day: 07/05 0701 - 07/06 0700 In: 480 [P.O.:480] Out: 1505 [Urine:1505]   Physical Exam:  Cardiovascular: RRR Pulmonary: Slightly diminished left base;otherwise, clear Abdomen: Soft, non tender, bowel sounds present. Extremities: Trace bilateral lower extremity edema R>L Wounds: Clean and dry.  No erythema or signs of infection.  Lab Results: CBC: Recent Labs    05/31/19 0318 06/01/19 0411  WBC 11.2* 10.3  HGB 9.3* 9.2*  HCT 28.1* 28.3*  PLT 395 381   BMET:  Recent Labs    05/31/19 0318 06/01/19 0411  NA 136 136  K 3.3* 3.6  CL 95* 98  CO2 29 29  GLUCOSE 99 100*  BUN 21 17  CREATININE 1.14 1.19  CALCIUM 8.6* 8.5*    PT/INR:  Lab Results  Component Value Date   INR 1.4 (H) 05/25/2019   INR 1.0 05/23/2019   ABG:  INR: Will add last result for INR, ABG once components are confirmed Will add last 4 CBG results once components are confirmed  Assessment/Plan:  1. CV - Previous a fib. He has been maintaining SR. HR is in the 70-80's this am. On Amiodarone 200 mg bid, Lopressor 12.5 mg bid. He is on back up VVI;disconnect and will remove EPW later today if  remains stable. Would like to start low dose ACE-creatinine this am 1.19; Will likely start in am. 2.  Pulmonary - On room air. CXR this am shows small left pleural effusion/atelectasis. Encourage incentive spirometer. 3. Volume Overload - On Lasix 40 mg daily. 4.  Acute blood loss anemia - H and H this am stable at 9.2 and 28.3 5. Supplement potassium 6. Pre op HGA1C 5.7. He is borderline for diabetes. Will provide nutritional information and ask that he follow up with his medical doctor after discharge.  7. Possible discharge in am  [place] 06/01/2019,8:12 AM [phone]

## 2019-06-01 NOTE — Progress Notes (Addendum)
Patient wife called and updated on patient and the time of discharge this afternoon. All questions answered at this time. Porter Moes, Bettina Gavia  rN

## 2019-06-01 NOTE — Progress Notes (Signed)
CARDIAC REHAB PHASE I   PRE:  Rate/Rhythm: 72 SR  BP:  Supine:   Sitting: 138/58  Standing:    SaO2: 95%RA  MODE:  Ambulation: 400 ft   POST:  Rate/Rhythm: 81 SR  BP:  Supine: 148/68  Sitting:   Standing:    SaO2: 94%RA 0912-1010 Pt assisted to bathroom and then we walked 400 ft on RA. Pt has steady gait with rolling walker. Stated he has walker at home to use. Still with right ankle pain. Assisted to bed for pacing wire removal. Education completed with pt who voiced understanding. Reviewed sternal precautions, IS, walking for ex, and heart healthy food choices. Gave in the tube handout and discussed keeping arms close to the body. Discussed CRP 2 and referred to [place] CRP 2.   Graylon Good, RN BSN  06/01/2019 10:05 AM

## 2019-06-01 NOTE — Progress Notes (Signed)
During shift report patient up to chair. Will monitor. Per [place], Bettina Gavia RN

## 2019-06-01 NOTE — Progress Notes (Addendum)
7 Days Post-Op Procedure(s) (LRB): CORONARY ARTERY BYPASS GRAFTING (CABG) times four using left internal mammary artery and right leg saphenous vein (N/A) TRANSESOPHAGEAL ECHOCARDIOGRAM (TEE) (N/A) Subjective: Heart rate has been stable 75-80 We will remove EP W's today Chest x-ray this a.m. clear Incisions clean and dry Neuritic type pain at right ankle is improved Plan discharge later today 6 hours after wires pulled  Objective: Vital signs in last 24 hours: Temp:  [98 F (36.7 C)-98.5 F (36.9 C)] 98.2 F (36.8 C) (07/06 0540) Pulse Rate:  [68-82] 82 (07/06 0825) Cardiac Rhythm: Normal sinus rhythm (07/06 0835) Resp:  [14-20] 19 (07/06 0823) BP: (124-145)/(55-106) 134/106 (07/06 0823) SpO2:  [92 %-97 %] 97 % (07/06 0823) Weight:  [112.3 kg] 112.3 kg (07/06 0540)  Hemodynamic parameters for last 24 hours:  Normal sinus rhythm  Intake/Output from previous day: 07/05 0701 - 07/06 0700 In: 480 [P.O.:480] Out: 1505 [Urine:1505] Intake/Output this shift: No intake/output data recorded.        Exam    General- alert and comfortable    Neck- no JVD, no cervical adenopathy palpable, no carotid bruit   Lungs- clear without rales, wheezes   Cor- regular rate and rhythm, no murmur , gallop   Abdomen- soft, non-tender   Extremities - warm, non-tender, minimal edema   Neuro- oriented, appropriate, no focal weakness   Lab Results: Recent Labs    05/31/19 0318 06/01/19 0411  WBC 11.2* 10.3  HGB 9.3* 9.2*  HCT 28.1* 28.3*  PLT 395 381   BMET:  Recent Labs    05/31/19 0318 06/01/19 0411  NA 136 136  K 3.3* 3.6  CL 95* 98  CO2 29 29  GLUCOSE 99 100*  BUN 21 17  CREATININE 1.14 1.19  CALCIUM 8.6* 8.5*    PT/INR: No results for input(s): LABPROT, INR in the last 72 hours. ABG    Component Value Date/Time   PHART 7.427 05/26/2019 1612   HCO3 23.5 05/26/2019 1612   TCO2 25 05/26/2019 1612   ACIDBASEDEF 1.0 05/26/2019 1612   O2SAT 48.3 05/27/2019 1442   CBG  (last 3)  No results for input(s): GLUCAP in the last 72 hours.  Assessment/Plan: S/P Procedure(s) (LRB): CORONARY ARTERY BYPASS GRAFTING (CABG) times four using left internal mammary artery and right leg saphenous vein (N/A) TRANSESOPHAGEAL ECHOCARDIOGRAM (TEE) (N/A) Mobilize Plan for discharge: see discharge orders   Lopressor 12.5 twice daily, amiodarone 200 twice daily once a day, Pravachol, Lasix 40 mg 1 week, aspirin pain medicine   LOS: 10 days    Tony Holloway 06/01/2019

## 2019-06-01 NOTE — Progress Notes (Signed)
[facility] will sign off.   Medication Recommendations:  Continue current medications Other recommendations (labs, testing, etc):  LFTS and lipid panel in 6 weeks Follow up as an outpatient:  With Dr. Geraldo Pitter 7/22 @ [facility], [place]

## 2019-06-01 NOTE — Progress Notes (Addendum)
Patient given discharge instructions medication list and prescriptions sent to personal pharmacy. Follow up appointments given and patient verbalized understanding. All questions answered. IV and tele dcd. Pt NSR on monitor rate of 74 prior to discharge Will discharge home as ordered. Transported to exit via wheel chair and nursing staff. Caidon Foti, Bettina Gavia RN

## 2019-06-01 NOTE — Progress Notes (Signed)
Patient EPW pulled per protocol and as ordered. All ends intact. Patient reminded to lie supine approximately one hour. CT sutures removed as ordered. Steri strips applied. Patient currently watching Discharge video. bp 125/61 heart rate 64 on monitor.  Thales Knipple, Bettina Gavia RN

## 2019-06-01 NOTE — Discharge Instructions (Signed)
Discharge Instructions:  1. You may shower, please wash incisions daily with soap and water and keep dry.  If you wish to cover wounds with dressing you may do so but please keep clean and change daily.  No tub baths or swimming until incisions have completely healed.  If your incisions become red or develop any drainage please call our office at [phone]  2. No Driving until cleared by Dr. Lucianne Lei Trigt's office and you are no longer using narcotic pain medications  3. Monitor your weight daily.. Please use the same scale and weigh at same time... If you gain 5-10 lbs in 48 hours with associated lower extremity swelling, please contact our office at [phone]  4. Fever of 101.5 for at least 24 hours with no source, please contact our office at [phone]  5. Activity- up as tolerated, please walk at least 3 times per day.  Avoid strenuous activity, no lifting, pushing, or pulling with your arms over 8-10 lbs for a minimum of 6 weeks  6. If any questions or concerns arise, please do not hesitate to contact our office at [phone]   Prediabetes Eating Plan Prediabetes is a condition that causes blood sugar (glucose) levels to be higher than normal. This increases the risk for developing diabetes. In order to prevent diabetes from developing, your health care provider may recommend a diet and other lifestyle changes to help you:  Control your blood glucose levels.  Improve your cholesterol levels.  Manage your blood pressure. Your health care provider may recommend working with a diet and nutrition specialist (dietitian) to make a meal plan that is best for you. What are tips for following this plan? Lifestyle  Set weight loss goals with the help of your health care team. It is recommended that most people with prediabetes lose 7% of their current body weight.  Exercise for at least 30 minutes at least 5 days a week.  Attend a support group or seek ongoing support from a mental  health counselor.  Take over-the-counter and prescription medicines only as told by your health care provider. Reading food labels  Read food labels to check the amount of fat, salt (sodium), and sugar in prepackaged foods. Avoid foods that have: ? Saturated fats. ? Trans fats. ? Added sugars.  Avoid foods that have more than 300 milligrams (mg) of sodium per serving. Limit your daily sodium intake to less than 2,300 mg each day. Shopping  Avoid buying pre-made and processed foods. Cooking  Cook with olive oil. Do not use butter, lard, or ghee.  Bake, broil, grill, or boil foods. Avoid frying. Meal planning   Work with your dietitian to develop an eating plan that is right for you. This may include: ? Tracking how many calories you take in. Use a food diary, notebook, or mobile application to track what you eat at each meal. ? Using the glycemic index (GI) to plan your meals. The index tells you how quickly a food will raise your blood glucose. Choose low-GI foods. These foods take a longer time to raise blood glucose.  Consider following a Mediterranean diet. This diet includes: ? Several servings each day of fresh fruits and vegetables. ? Eating fish at least twice a week. ? Several servings each day of whole grains, beans, nuts, and seeds. ? Using olive oil instead of other fats. ? Moderate alcohol consumption. ? Eating small amounts of red meat and whole-fat dairy.  If you have high blood pressure, you may need  to limit your sodium intake or follow a diet such as the DASH eating plan. DASH is an eating plan that aims to lower high blood pressure. What foods are recommended? The items listed below may not be a complete list. Talk with your dietitian about what dietary choices are best for you. Grains Whole grains, such as whole-wheat or whole-grain breads, crackers, cereals, and pasta. Unsweetened oatmeal. Bulgur. Barley. Quinoa. Brown rice. Corn or whole-wheat flour  tortillas or taco shells. Vegetables Lettuce. Spinach. Peas. Beets. Cauliflower. Cabbage. Broccoli. Carrots. Tomatoes. Squash. Eggplant. Herbs. Peppers. Onions. Cucumbers. Brussels sprouts. Fruits Berries. Bananas. Apples. Oranges. Grapes. Papaya. Mango. Pomegranate. Kiwi. Grapefruit. Cherries. Meats and other protein foods Seafood. Poultry without skin. Lean cuts of pork and beef. Tofu. Eggs. Nuts. Beans. Dairy Low-fat or fat-free dairy products, such as yogurt, cottage cheese, and cheese. Beverages Water. Tea. Coffee. Sugar-free or diet soda. Seltzer water. Lowfat or no-fat milk. Milk alternatives, such as soy or almond milk. Fats and oils Olive oil. Canola oil. Sunflower oil. Grapeseed oil. Avocado. Walnuts. Sweets and desserts Sugar-free or low-fat pudding. Sugar-free or low-fat ice cream and other frozen treats. Seasoning and other foods Herbs. Sodium-free spices. Mustard. Relish. Low-fat, low-sugar ketchup. Low-fat, low-sugar barbecue sauce. Low-fat or fat-free mayonnaise. What foods are not recommended? The items listed below may not be a complete list. Talk with your dietitian about what dietary choices are best for you. Grains Refined white flour and flour products, such as bread, pasta, snack foods, and cereals. Vegetables Canned vegetables. Frozen vegetables with butter or cream sauce. Fruits Fruits canned with syrup. Meats and other protein foods Fatty cuts of meat. Poultry with skin. Breaded or fried meat. Processed meats. Dairy Full-fat yogurt, cheese, or milk. Beverages Sweetened drinks, such as sweet iced tea and soda. Fats and oils Butter. Lard. Ghee. Sweets and desserts Baked goods, such as cake, cupcakes, pastries, cookies, and cheesecake. Seasoning and other foods Spice mixes with added salt. Ketchup. Barbecue sauce. Mayonnaise. Summary  To prevent diabetes from developing, you may need to make diet and other lifestyle changes to help control blood sugar,  improve cholesterol levels, and manage your blood pressure.  Set weight loss goals with the help of your health care team. It is recommended that most people with prediabetes lose 7 percent of their current body weight.  Consider following a Mediterranean diet that includes plenty of fresh fruits and vegetables, whole grains, beans, nuts, seeds, fish, lean meat, low-fat dairy, and healthy oils. This information is not intended to replace advice given to you by your health care provider. Make sure you discuss any questions you have with your health care provider. Document Released: 03/29/2015 Document Revised: 03/06/2019 Document Reviewed: 01/16/2017 Elsevier Patient Education  2020 [employer].

## 2019-06-03 ENCOUNTER — Telehealth: Payer: Self-pay | Admitting: *Deleted

## 2019-06-03 NOTE — Telephone Encounter (Signed)
Tony Holloway has called on behalf of her husband, Tony Holloway.  He is s/p CABG 05/25/19, discharged 06/01/19.  He had an episode of blood in his stool while in the hospital x 1.  He was given stool softeners.  He has seen blood   in the toilet bowl and on the toilet tissue x 2 since being at home and also is having runny, soft stools. He is having abdominal cramping.  No h/o hemorrhoids.  Dr. Prescott Gum was consulted and recommended Anusol and Pepto-Bismol as directed.  I will call tomorrow and check his status.  His wife agreed.

## 2019-06-17 ENCOUNTER — Encounter: Payer: Self-pay | Admitting: Cardiology

## 2019-06-17 ENCOUNTER — Ambulatory Visit (INDEPENDENT_AMBULATORY_CARE_PROVIDER_SITE_OTHER): Payer: PPO | Admitting: Cardiology

## 2019-06-17 ENCOUNTER — Telehealth: Payer: Self-pay | Admitting: *Deleted

## 2019-06-17 VITALS — BP 126/72 | HR 65 | Ht 71.0 in | Wt 234.0 lb

## 2019-06-17 DIAGNOSIS — E663 Overweight: Secondary | ICD-10-CM | POA: Diagnosis not present

## 2019-06-17 DIAGNOSIS — I9789 Other postprocedural complications and disorders of the circulatory system, not elsewhere classified: Secondary | ICD-10-CM

## 2019-06-17 DIAGNOSIS — E782 Mixed hyperlipidemia: Secondary | ICD-10-CM

## 2019-06-17 DIAGNOSIS — I2510 Atherosclerotic heart disease of native coronary artery without angina pectoris: Secondary | ICD-10-CM | POA: Diagnosis not present

## 2019-06-17 DIAGNOSIS — Z951 Presence of aortocoronary bypass graft: Secondary | ICD-10-CM

## 2019-06-17 DIAGNOSIS — I4891 Unspecified atrial fibrillation: Secondary | ICD-10-CM | POA: Diagnosis not present

## 2019-06-17 MED ORDER — METOPROLOL SUCCINATE ER 25 MG PO TB24: 25.0000 mg | ORAL_TABLET | Freq: Every day | ORAL | 1 refills | Status: DC

## 2019-06-17 NOTE — Telephone Encounter (Signed)
Rx refill sent to pharmacy.  *STAT* If patient is at the pharmacy, call can be transferred to refill team.   1. Which medications need to be refilled? (please list name of each medication and dose if known) Metoprolol 25 mg qd  2. Which pharmacy/location (including street and city if local pharmacy) is medication to be sent to?[facility]  3. Do they need a 30 day or 90 day supply? [place]

## 2019-06-17 NOTE — Progress Notes (Signed)
Cardiology Office Note:    Date:  06/17/2019   ID:  IRAM LUNDBERG, DOB Oct 11, 1946, MRN [id number]  PCP:  Street, Sharon Mt, MD  Cardiologist:  Jenean Lindau, MD   Referring MD: [address], Sharon Mt, *    ASSESSMENT:    1. 3-vessel CAD   2. Postoperative atrial fibrillation (HCC)   3. S/P CABG x 4   4. Overweight   5. Mixed dyslipidemia    PLAN:    In order of problems listed above:  1. Coronary artery disease: Secondary prevention stressed with the patient.  Importance of compliance with diet and medication stressed and he vocalized understanding.  His blood pressure stable.  Diet was discussed and weight reduction was stressed.  Exercise protocol was outlined and he vocalized understanding and is agreeable. 2. Essential hypertension: Blood pressure stable 3. Mixed dyslipidemia: Diet was discussed.  I reviewed blood work done recently.  He will be back in follow-up appointment in 2 months or earlier if he has any concerns.  At that time we will do a fasting liver lipid check also.  I reviewed hospital records extensively and questions were answered to his satisfaction.   Medication Adjustments/Labs and Tests Ordered: Current medicines are reviewed at length with the patient today.  Concerns regarding medicines are outlined above.  No orders of the defined types were placed in this encounter.  No orders of the defined types were placed in this encounter.    No chief complaint on file.    History of Present Illness:    Tony Holloway is a 73 y.o. male.  Patient has past medical history of coronary artery disease post CABG surgery.  He was sent by me to [facility] for chest pain suggesting angina he was found to have three-vessel coronary artery disease and underwent bypass surgery.  Subsequently is done fine.  No chest pain orthopnea or PND.  He is walking on a regular basis.  He is very happy about the outcome.  At the time of my evaluation, the patient is alert  awake oriented and in no distress.  Past Medical History:  Diagnosis Date  . 3-vessel CAD 05/22/2019   Pre-op Cath May 22, 2019: ostLM 80%. pLAD 30% & mLAD 50%, p-mCx 40% - OM1 90%, OM2 95%; pRCA 95% (ulcerated plaque).  EF roughly 50% with small focal inferior hypokinesis. -->  Urgent CVTS consult  . Hypertension   . Postoperative atrial fibrillation ([place]) 05/29/2019   post CABG   . S/P CABG x 4 05/26/2019   CABG x 4 05/25/2019 ( Dr. Darcey Nora): LIMA-LAD, SVG-RI (OM1), SVG-LPL, SVG-rPDA)    Past Surgical History:  Procedure Laterality Date  . CATARACT EXTRACTION W/ INTRAOCULAR LENS IMPLANT    . CORONARY ARTERY BYPASS GRAFT N/A 05/25/2019   Procedure: CORONARY ARTERY BYPASS GRAFTING (CABG) times four using left internal mammary artery and right leg saphenous vein;  Surgeon: Ivin Poot, MD;  Location: [place];  Service: Open Heart Surgery;  Laterality: N/A;  . HAND SURGERY    . HERNIA REPAIR  1999  . LEFT HEART CATH AND CORONARY ANGIOGRAPHY N/A 05/22/2019   Procedure: LEFT HEART CATH AND CORONARY ANGIOGRAPHY;  Surgeon: Troy Sine, MD;  Location: [place] CV [facility];  Service: Cardiovascular;  Laterality: N/A;  . TEE WITHOUT CARDIOVERSION N/A 05/25/2019   Procedure: TRANSESOPHAGEAL ECHOCARDIOGRAM (TEE);  Surgeon: Prescott Gum, Collier Salina, MD;  Location: [place];  Service: Open Heart Surgery;  Laterality: N/A;    Current  Medications: Current Meds  Medication Sig  . amiodarone (PACERONE) 200 MG tablet Take 1 tablet (200 mg total) by mouth 2 (two) times daily. X 7 days, then take 200 mg daily  . aspirin EC 81 MG tablet Take 81 mg by mouth daily.  . cetirizine (ZYRTEC) 10 MG tablet Take 10 mg by mouth daily at 12 noon.  . cyclobenzaprine (FLEXERIL) 10 MG tablet Take 1 tablet (10 mg total) by mouth 3 (three) times daily as needed for muscle spasms.  . furosemide (LASIX) 40 MG tablet Take 1 tablet (40 mg total) by mouth daily.  . metoprolol succinate (TOPROL-XL) 25 MG 24 hr tablet Take 1 tablet (25  mg total) by mouth daily.  . Misc Natural Products (OSTEO BI-FLEX JOINT SHIELD PO) Take 1 tablet by mouth daily at 12 noon.  . montelukast (SINGULAIR) 10 MG tablet TAKE ONE TABLET BY MOUTH AT BEDTIME (Patient taking differently: Take 10 mg by mouth at bedtime. )  . nitroGLYCERIN (NITROSTAT) 0.4 MG SL tablet Place 0.4 mg under the tongue every 5 (five) minutes x 3 doses as needed for chest pain.   Marland Kitchen omeprazole (PRILOSEC) 20 MG capsule Take 20 mg by mouth daily.  . potassium chloride SA (K-DUR) 20 MEQ tablet Take 2 tablets (40 mEq total) by mouth daily.  . pravastatin (PRAVACHOL) 20 MG tablet Take 1 tablet (20 mg total) by mouth 2 (two) times daily.  Marland Kitchen tetrahydrozoline (VISINE) 0.05 % ophthalmic solution Place 1 drop into both eyes 3 (three) times daily as needed (dry/irritated eyes.).  Marland Kitchen TRACE MIN CACRCUFEKMGMNPSEZN PO Take 1 tablet by mouth daily at 12 noon. Trace Mineral Tablet (OTC)  . traMADol (ULTRAM) 50 MG tablet Take 1-2 tablets (50-100 mg total) by mouth every 4 (four) hours as needed for moderate pain.     Allergies:   Codeine   Social History   Socioeconomic History  . Marital status: Married    Spouse name: Not on file  . Number of children: Not on file  . Years of education: Not on file  . Highest education level: Not on file  Occupational History  . Not on file  Social Needs  . Financial resource strain: Not on file  . Food insecurity    Worry: Not on file    Inability: Not on file  . Transportation needs    Medical: Not on file    Non-medical: Not on file  Tobacco Use  . Smoking status: Former Smoker    Types: Cigarettes    Quit date: 09/01/1978    Years since quitting: 40.8  . Smokeless tobacco: Never Used  Substance and Sexual Activity  . Alcohol use: Never    Frequency: Never  . Drug use: Never  . Sexual activity: Not on file  Lifestyle  . Physical activity    Days per week: Not on file    Minutes per session: Not on file  . Stress: Not on file   Relationships  . Social Herbalist on phone: Not on file    Gets together: Not on file    Attends religious service: Not on file    Active member of club or organization: Not on file    Attends meetings of clubs or organizations: Not on file    Relationship status: Not on file  Other Topics Concern  . Not on file  Social History Narrative  . Not on file     Family History: The patient's family history includes  Lung cancer in his father.  ROS:   Please see the history of present illness.    All other systems reviewed and are negative.  EKGs/Labs/Other Studies Reviewed:    The following studies were reviewed today: LEFT HEART CATH AND CORONARY ANGIOGRAPHY  Conclusion    Prox RCA lesion is 95% stenosed.  Prox LAD lesion is 30% stenosed.  Prox LAD to Mid LAD lesion is 50% stenosed.  Ost LM lesion is 80% stenosed.  Prox Cx to Mid Cx lesion is 40% stenosed.  1st Mrg-1 lesion is 90% stenosed.  1st Mrg-2 lesion is 95% stenosed.  LV end diastolic pressure is low.  There is mild left ventricular systolic dysfunction.  The left ventricular ejection fraction is 50-55% by visual estimate.   Evidence for mild coronary calcification with significant multivessel CAD with 80% ostial tapering of the left main; 30 and 50% proximal to mid LAD stenoses; 40% proximal circumflex stenosis with 90 and 95% ostial and mid stenosis in the OM1 vessel; and large dominant RCA with 95% ulcerated plaque appearing vessel proximally.  Low normal LV function with EF estimated 50% with small area of focal mid inferior hypocontractility.  LVEDP 8 mmHg  RECOMMENDATION: The patient will be admitted to the hospital.  Surgical consultation for consideration of CABG revascularization.  Will initiate high potency statin therapy, nitrates and beta-blockade with aspirin pending surgery.       Recent Labs: 05/23/2019: ALT 20; TSH 3.174 05/26/2019: Magnesium 2.2 06/01/2019: BUN 17;  Creatinine, Ser 1.19; Hemoglobin 9.2; Platelets 381; Potassium 3.6; Sodium 136  Recent Lipid Panel    Component Value Date/Time   CHOL 114 01/08/2019 0831   TRIG 119 01/08/2019 0831   HDL 31 (L) 01/08/2019 0831   CHOLHDL 3.7 01/08/2019 0831   LDLCALC 59 01/08/2019 0831    Physical Exam:    VS:  BP 126/72 (BP Location: Left Arm, Patient Position: Sitting, Cuff Size: Normal)   Pulse 65   Ht 5\' 11"  (1.803 m)   Wt 234 lb (106.1 kg)   SpO2 99%   BMI 32.64 kg/m     Wt Readings from Last 3 Encounters:  06/17/19 234 lb (106.1 kg)  06/01/19 247 lb 8 oz (112.3 kg)  05/18/19 248 lb (112.5 kg)     GEN: Patient is in no acute distress HEENT: Normal NECK: No JVD; No carotid bruits LYMPHATICS: No lymphadenopathy CARDIAC: Hear sounds regular, 2/6 systolic murmur at the apex. RESPIRATORY:  Clear to auscultation without rales, wheezing or rhonchi  ABDOMEN: Soft, non-tender, non-distended MUSCULOSKELETAL:  No edema; No deformity  SKIN: Warm and dry NEUROLOGIC:  Alert and oriented x 3 PSYCHIATRIC:  Normal affect   Signed, Jenean Lindau, MD  06/17/2019 1:56 PM    [facility]

## 2019-06-17 NOTE — Telephone Encounter (Signed)
*  STAT* If patient is at the pharmacy, call can be transferred to refill team.   1. Which medications need to be refilled? (please list name of each medication and dose if known) Metoprolol 25 mg  2. Which pharmacy/location (including street and city if local pharmacy) is medication to be sent to?[facility]  3. Do they need a 30 day or 90 day supply? [place]

## 2019-06-17 NOTE — Patient Instructions (Addendum)
Medication Instructions: Your physician recommends that you continue on your current medications as directed. Please refer to the Current Medication list given to you today.  If you need a refill on your cardiac medications before your next appointment, please call your pharmacy.   Lab work: NONE If you have labs (blood work) drawn today and your tests are completely normal, you will receive your results only by: . MyChart Message (if you have MyChart) OR . A paper copy in the mail If you have any lab test that is abnormal or we need to change your treatment, we will call you to review the results.  Testing/Procedures: NONE  Follow-Up: At [facility], you and your health needs are our priority.  As part of our continuing mission to provide you with exceptional heart care, we have created designated Provider Care Teams.  These Care Teams include your primary Cardiologist (physician) and Advanced Practice Providers (APPs -  Physician Assistants and Nurse Practitioners) who all work together to provide you with the care you need, when you need it. You will need a follow up appointment in 2 months.    

## 2019-07-01 ENCOUNTER — Encounter: Payer: Self-pay | Admitting: Cardiothoracic Surgery

## 2019-07-01 ENCOUNTER — Ambulatory Visit
Admission: RE | Admit: 2019-07-01 | Discharge: 2019-07-01 | Disposition: A | Discharge status: Home / Self Care | Payer: PPO | Source: Ambulatory Visit | Attending: Cardiothoracic Surgery | Admitting: Cardiothoracic Surgery

## 2019-07-01 ENCOUNTER — Other Ambulatory Visit: Payer: Self-pay | Admitting: Cardiothoracic Surgery

## 2019-07-01 ENCOUNTER — Ambulatory Visit (INDEPENDENT_AMBULATORY_CARE_PROVIDER_SITE_OTHER): Payer: Self-pay | Admitting: Cardiothoracic Surgery

## 2019-07-01 ENCOUNTER — Other Ambulatory Visit: Payer: Self-pay

## 2019-07-01 VITALS — BP 143/73 | HR 84 | Temp 97.3°F | Resp 16 | Ht 71.0 in | Wt 234.0 lb

## 2019-07-01 DIAGNOSIS — I517 Cardiomegaly: Secondary | ICD-10-CM | POA: Diagnosis not present

## 2019-07-01 DIAGNOSIS — Z951 Presence of aortocoronary bypass graft: Secondary | ICD-10-CM

## 2019-07-01 DIAGNOSIS — I2510 Atherosclerotic heart disease of native coronary artery without angina pectoris: Secondary | ICD-10-CM

## 2019-07-01 NOTE — Progress Notes (Signed)
PCP is Street, Sharon Mt, MD Referring Provider is Revankar, Reita Cliche, MD  Chief Complaint  Patient presents with  . Routine Post Op    f/u from surgery with CXR s/p Coronary artery bypass grafting x4, 05/25/19    HPI: Scheduled I month follow-up after CABG x4 following diagnosis of severe three-vessel coronary disease with class III angina.  The patient is doing well.  The patient had transient postop atrial fibrillation and was sent home on amiodarone.  He has maintained sinus rhythm since discharge so he will discontinue amiodarone after another week.  He is walking at least 1 mile per day and has had no recurrent angina.  No sternal clicking or popping sensation.  No ankle edema.  Overall strength appetite and sleeping are improved since returning home.  He has a goal to lose another 20 pounds by mid fall.   Past Medical History:  Diagnosis Date  . 3-vessel CAD 05/22/2019   Pre-op Cath May 22, 2019: ostLM 80%. pLAD 30% & mLAD 50%, p-mCx 40% - OM1 90%, OM2 95%; pRCA 95% (ulcerated plaque).  EF roughly 50% with small focal inferior hypokinesis. -->  Urgent CVTS consult  . Hypertension   . Postoperative atrial fibrillation ([place]) 05/29/2019   post CABG   . S/P CABG x 4 05/26/2019   CABG x 4 05/25/2019 ( Dr. Darcey Nora): LIMA-LAD, SVG-RI (OM1), SVG-LPL, SVG-rPDA)    Past Surgical History:  Procedure Laterality Date  . CATARACT EXTRACTION W/ INTRAOCULAR LENS IMPLANT    . CORONARY ARTERY BYPASS GRAFT N/A 05/25/2019   Procedure: CORONARY ARTERY BYPASS GRAFTING (CABG) times four using left internal mammary artery and right leg saphenous vein;  Surgeon: Ivin Poot, MD;  Location: [place];  Service: Open Heart Surgery;  Laterality: N/A;  . HAND SURGERY    . HERNIA REPAIR  1999  . LEFT HEART CATH AND CORONARY ANGIOGRAPHY N/A 05/22/2019   Procedure: LEFT HEART CATH AND CORONARY ANGIOGRAPHY;  Surgeon: Troy Sine, MD;  Location: [place] [facility];  Service: Cardiovascular;  Laterality: N/A;   . TEE WITHOUT CARDIOVERSION N/A 05/25/2019   Procedure: TRANSESOPHAGEAL ECHOCARDIOGRAM (TEE);  Surgeon: Prescott Gum, Collier Salina, MD;  Location: [place];  Service: Open Heart Surgery;  Laterality: N/A;    Family History  Problem Relation Age of Onset  . Lung cancer Father     Social History Social History   Tobacco Use  . Smoking status: Former Smoker    Types: Cigarettes    Quit date: 09/01/1978    Years since quitting: 40.8  . Smokeless tobacco: Never Used  Substance Use Topics  . Alcohol use: Never    Frequency: Never  . Drug use: Never    Current Outpatient Medications  Medication Sig Dispense Refill  . amiodarone (PACERONE) 200 MG tablet Take 1 tablet (200 mg total) by mouth 2 (two) times daily. X 7 days, then take 200 mg daily 60 tablet 1  . aspirin EC 81 MG tablet Take 81 mg by mouth daily.    . cetirizine (ZYRTEC) 10 MG tablet Take 10 mg by mouth daily at 12 noon.    . cyclobenzaprine (FLEXERIL) 10 MG tablet Take 1 tablet (10 mg total) by mouth 3 (three) times daily as needed for muscle spasms. 30 tablet 0  . furosemide (LASIX) 40 MG tablet Take 1 tablet (40 mg total) by mouth daily. 7 tablet 0  . metoprolol succinate (TOPROL-XL) 25 MG 24 hr tablet Take 1 tablet (25 mg total) by mouth  daily. 90 tablet 1  . Misc Natural Products (OSTEO BI-FLEX JOINT SHIELD PO) Take 1 tablet by mouth daily at 12 noon.    . montelukast (SINGULAIR) 10 MG tablet TAKE ONE TABLET BY MOUTH AT BEDTIME (Patient taking differently: Take 10 mg by mouth at bedtime. ) 30 tablet 0  . nitroGLYCERIN (NITROSTAT) 0.4 MG SL tablet Place 0.4 mg under the tongue every 5 (five) minutes x 3 doses as needed for chest pain.     Marland Kitchen omeprazole (PRILOSEC) 20 MG capsule Take 20 mg by mouth daily.    . potassium chloride SA (K-DUR) 20 MEQ tablet Take 2 tablets (40 mEq total) by mouth daily. 7 tablet 0  . pravastatin (PRAVACHOL) 20 MG tablet Take 1 tablet (20 mg total) by mouth 2 (two) times daily. 60 tablet 3  . tetrahydrozoline  (VISINE) 0.05 % ophthalmic solution Place 1 drop into both eyes 3 (three) times daily as needed (dry/irritated eyes.).    Marland Kitchen TRACE MIN CACRCUFEKMGMNPSEZN PO Take 1 tablet by mouth daily at 12 noon. Trace Mineral Tablet (OTC)     No current facility-administered medications for this visit.     Allergies  Allergen Reactions  . Codeine Other (See Comments)    Shaking, dizzy, lightheaded    Review of Systems  No fever No drainage from incisions No dizziness or syncope  BP (!) 143/73 (BP Location: Right Arm, Patient Position: Sitting, Cuff Size: Normal)   Pulse 84   Temp (!) 97.3 F (36.3 C)   Resp 16   Ht 5\' 11"  (1.803 m)   Wt 234 lb (106.1 kg)   SpO2 97% Comment: RA  BMI 32.64 kg/m  Physical Exam      Exam    General- alert and comfortable    Neck- no JVD, no cervical adenopathy palpable, no carotid bruit   Lungs- clear without rales, wheezes   Cor- regular rate and rhythm, no murmur , gallop.  Surgical incision well-healed.   Abdomen- soft, non-tender   Extremities - warm, non-tender, minimal edema   Neuro- oriented, appropriate, no focal weakness   Diagnostic Tests: Chest x-ray today personally reviewed showing clear lung fields no pleural effusions.  Sternal wires intact.  Impression: Excellent early recovery 1 month after CABG x4.  Patient will discontinue amiodarone.  He will continue his walking program.  We will refer him to Cardiac rehab at [place]: Return in 6 weeks for final review of progress..  Do not lift more than 20 pounds until that time.   Len Childs, MD [facility] [phone]

## 2019-08-12 ENCOUNTER — Other Ambulatory Visit: Payer: Self-pay

## 2019-08-12 ENCOUNTER — Ambulatory Visit (INDEPENDENT_AMBULATORY_CARE_PROVIDER_SITE_OTHER): Payer: Self-pay | Admitting: Cardiothoracic Surgery

## 2019-08-12 ENCOUNTER — Encounter: Payer: Self-pay | Admitting: Cardiothoracic Surgery

## 2019-08-12 VITALS — BP 150/83 | HR 61 | Temp 97.8°F | Resp 20 | Ht 71.0 in | Wt 241.0 lb

## 2019-08-12 DIAGNOSIS — Z951 Presence of aortocoronary bypass graft: Secondary | ICD-10-CM

## 2019-08-12 DIAGNOSIS — I2510 Atherosclerotic heart disease of native coronary artery without angina pectoris: Secondary | ICD-10-CM

## 2019-08-12 NOTE — Progress Notes (Signed)
PCP is Street, Sharon Mt, MD Referring Provider is Revankar, Reita Cliche, MD  Chief Complaint  Patient presents with  . Routine Post Op    6 week f/u, HX of CABG    HPI: Final office visit 1/2 months after urgent CABG x4 Patient doing well and can tell a difference in his exercise tolerance during his usual 4 mile walk.  He has been helping his wife recover from surgery but should be able to start outpatient cardiac rehab at the [facility] unit in October.  He has been lifting less than 10 pounds but now he understands after October 1 restrictions will be lifted.  He denies recurrent chest pain.  The incisions are well-healed.  Last chest x-ray was clear.  He has no edema.   Past Medical History:  Diagnosis Date  . 3-vessel CAD 05/22/2019   Pre-op Cath May 22, 2019: ostLM 80%. pLAD 30% & mLAD 50%, p-mCx 40% - OM1 90%, OM2 95%; pRCA 95% (ulcerated plaque).  EF roughly 50% with small focal inferior hypokinesis. -->  Urgent CVTS consult  . Hypertension   . Postoperative atrial fibrillation ([place]) 05/29/2019   post CABG   . S/P CABG x 4 05/26/2019   CABG x 4 05/25/2019 ( Dr. Darcey Nora): LIMA-LAD, SVG-RI (OM1), SVG-LPL, SVG-rPDA)    Past Surgical History:  Procedure Laterality Date  . CATARACT EXTRACTION W/ INTRAOCULAR LENS IMPLANT    . CORONARY ARTERY BYPASS GRAFT N/A 05/25/2019   Procedure: CORONARY ARTERY BYPASS GRAFTING (CABG) times four using left internal mammary artery and right leg saphenous vein;  Surgeon: Ivin Poot, MD;  Location: [place];  Service: Open Heart Surgery;  Laterality: N/A;  . HAND SURGERY    . HERNIA REPAIR  1999  . LEFT HEART CATH AND CORONARY ANGIOGRAPHY N/A 05/22/2019   Procedure: LEFT HEART CATH AND CORONARY ANGIOGRAPHY;  Surgeon: Troy Sine, MD;  Location: [place] [facility];  Service: Cardiovascular;  Laterality: N/A;  . TEE WITHOUT CARDIOVERSION N/A 05/25/2019   Procedure: TRANSESOPHAGEAL ECHOCARDIOGRAM (TEE);  Surgeon: Prescott Gum, Collier Salina, MD;   Location: [place];  Service: Open Heart Surgery;  Laterality: N/A;    Family History  Problem Relation Age of Onset  . Lung cancer Father     Social History Social History   Tobacco Use  . Smoking status: Former Smoker    Types: Cigarettes    Quit date: 09/01/1978    Years since quitting: 40.9  . Smokeless tobacco: Never Used  Substance Use Topics  . Alcohol use: Never    Frequency: Never  . Drug use: Never    Current Outpatient Medications  Medication Sig Dispense Refill  . amiodarone (PACERONE) 200 MG tablet Take 1 tablet (200 mg total) by mouth 2 (two) times daily. X 7 days, then take 200 mg daily 60 tablet 1  . aspirin EC 81 MG tablet Take 81 mg by mouth daily.    . cetirizine (ZYRTEC) 10 MG tablet Take 10 mg by mouth daily at 12 noon.    . cyclobenzaprine (FLEXERIL) 10 MG tablet Take 1 tablet (10 mg total) by mouth 3 (three) times daily as needed for muscle spasms. 30 tablet 0  . furosemide (LASIX) 40 MG tablet Take 1 tablet (40 mg total) by mouth daily. 7 tablet 0  . metoprolol succinate (TOPROL-XL) 25 MG 24 hr tablet Take 1 tablet (25 mg total) by mouth daily. 90 tablet 1  . Misc Natural Products (OSTEO BI-FLEX JOINT SHIELD PO) Take 1  tablet by mouth daily at 12 noon.    . montelukast (SINGULAIR) 10 MG tablet TAKE ONE TABLET BY MOUTH AT BEDTIME (Patient taking differently: Take 10 mg by mouth at bedtime. ) 30 tablet 0  . nitroGLYCERIN (NITROSTAT) 0.4 MG SL tablet Place 0.4 mg under the tongue every 5 (five) minutes x 3 doses as needed for chest pain.     Marland Kitchen omeprazole (PRILOSEC) 20 MG capsule Take 20 mg by mouth daily.    . potassium chloride SA (K-DUR) 20 MEQ tablet Take 2 tablets (40 mEq total) by mouth daily. 7 tablet 0  . pravastatin (PRAVACHOL) 20 MG tablet Take 1 tablet (20 mg total) by mouth 2 (two) times daily. 60 tablet 3  . tetrahydrozoline (VISINE) 0.05 % ophthalmic solution Place 1 drop into both eyes 3 (three) times daily as needed (dry/irritated eyes.).    Marland Kitchen  TRACE MIN CACRCUFEKMGMNPSEZN PO Take 1 tablet by mouth daily at 12 noon. Trace Mineral Tablet (OTC)     No current facility-administered medications for this visit.     Allergies  Allergen Reactions  . Codeine Other (See Comments)    Shaking, dizzy, lightheaded    Review of Systems  Weight stable up about 2 pounds No chest pain No edema No fever No symptoms of COVID-19 pneumonitis  BP (!) 150/83   Pulse 61   Temp 97.8 F (36.6 C) (Skin)   Resp 20   Ht 5\' 11"  (1.803 m)   Wt 241 lb (109.3 kg)   SpO2 98% Comment: RA  BMI 33.61 kg/m  Physical Exam      Exam    General- alert and comfortable    Neck- no JVD, no cervical adenopathy palpable, no carotid bruit   Lungs- clear without rales, wheezes.  Sternal incision healed.   Cor- regular rate and rhythm, no murmur , gallop   Abdomen- soft, non-tender   Extremities - warm, non-tender, minimal edema   Neuro- oriented, appropriate, no focal weakness   Diagnostic Tests: None today  Impression: Patient has recovered well after urgent CABG x4.  Plan: Patient understands importance of heart healthy lifestyle including minimum 20 minutes of ambulation 5 days a week and following a heart healthy diet.  He understands he should remain on aspirin and pravastatin indefinitely.  He will return here as needed.   Len Childs, MD [facility] [phone]

## 2019-08-25 ENCOUNTER — Ambulatory Visit: Payer: PPO | Admitting: Cardiology

## 2019-09-03 ENCOUNTER — Ambulatory Visit: Payer: PPO | Admitting: Cardiology

## 2019-09-17 DIAGNOSIS — Z23 Encounter for immunization: Secondary | ICD-10-CM | POA: Diagnosis not present

## 2019-10-02 ENCOUNTER — Telehealth: Payer: Self-pay | Admitting: Cardiology

## 2019-10-02 NOTE — Telephone Encounter (Signed)
Call prevastatin to urgent health care pharmacy

## 2019-10-03 ENCOUNTER — Other Ambulatory Visit: Payer: Self-pay | Admitting: Physician Assistant

## 2019-10-05 MED ORDER — PRAVASTATIN SODIUM 20 MG PO TABS: 20.0000 mg | ORAL_TABLET | Freq: Two times a day (BID) | ORAL | 3 refills | Status: AC

## 2019-10-05 NOTE — Addendum Note (Signed)
Addended by: Beckey Rutter on: 10/05/2019 02:40 PM   Modules accepted: Orders

## 2019-10-07 ENCOUNTER — Ambulatory Visit: Payer: PPO | Admitting: Cardiology

## 2019-10-28 DIAGNOSIS — I2510 Atherosclerotic heart disease of native coronary artery without angina pectoris: Secondary | ICD-10-CM | POA: Diagnosis not present

## 2019-10-28 DIAGNOSIS — I480 Paroxysmal atrial fibrillation: Secondary | ICD-10-CM | POA: Diagnosis not present

## 2019-10-28 DIAGNOSIS — E785 Hyperlipidemia, unspecified: Secondary | ICD-10-CM | POA: Diagnosis not present

## 2019-10-28 DIAGNOSIS — Z951 Presence of aortocoronary bypass graft: Secondary | ICD-10-CM | POA: Diagnosis not present

## 2019-11-02 ENCOUNTER — Ambulatory Visit: Payer: PPO | Admitting: Cardiology

## 2019-11-03 DIAGNOSIS — E785 Hyperlipidemia, unspecified: Secondary | ICD-10-CM | POA: Diagnosis not present

## 2019-11-03 DIAGNOSIS — I2510 Atherosclerotic heart disease of native coronary artery without angina pectoris: Secondary | ICD-10-CM | POA: Diagnosis not present

## 2019-12-03 ENCOUNTER — Other Ambulatory Visit: Payer: Self-pay | Admitting: Allergy and Immunology

## 2019-12-25 ENCOUNTER — Other Ambulatory Visit: Payer: Self-pay | Admitting: Cardiology

## 2019-12-30 DIAGNOSIS — Z951 Presence of aortocoronary bypass graft: Secondary | ICD-10-CM | POA: Diagnosis not present

## 2019-12-30 DIAGNOSIS — E785 Hyperlipidemia, unspecified: Secondary | ICD-10-CM | POA: Diagnosis not present

## 2019-12-30 DIAGNOSIS — I480 Paroxysmal atrial fibrillation: Secondary | ICD-10-CM | POA: Diagnosis not present

## 2019-12-30 DIAGNOSIS — I2510 Atherosclerotic heart disease of native coronary artery without angina pectoris: Secondary | ICD-10-CM | POA: Diagnosis not present

## 2020-06-03 DIAGNOSIS — I10 Essential (primary) hypertension: Secondary | ICD-10-CM | POA: Diagnosis not present

## 2020-06-03 DIAGNOSIS — K219 Gastro-esophageal reflux disease without esophagitis: Secondary | ICD-10-CM | POA: Diagnosis not present

## 2020-06-03 DIAGNOSIS — Z79899 Other long term (current) drug therapy: Secondary | ICD-10-CM | POA: Diagnosis not present

## 2020-06-03 DIAGNOSIS — I25119 Atherosclerotic heart disease of native coronary artery with unspecified angina pectoris: Secondary | ICD-10-CM | POA: Diagnosis not present

## 2020-06-03 DIAGNOSIS — Z125 Encounter for screening for malignant neoplasm of prostate: Secondary | ICD-10-CM | POA: Diagnosis not present

## 2020-06-03 DIAGNOSIS — E785 Hyperlipidemia, unspecified: Secondary | ICD-10-CM | POA: Diagnosis not present

## 2020-06-03 DIAGNOSIS — J320 Chronic maxillary sinusitis: Secondary | ICD-10-CM | POA: Diagnosis not present

## 2020-06-03 DIAGNOSIS — Z951 Presence of aortocoronary bypass graft: Secondary | ICD-10-CM | POA: Diagnosis not present

## 2020-06-03 DIAGNOSIS — I252 Old myocardial infarction: Secondary | ICD-10-CM | POA: Diagnosis not present

## 2020-06-03 DIAGNOSIS — D485 Neoplasm of uncertain behavior of skin: Secondary | ICD-10-CM | POA: Diagnosis not present

## 2020-06-03 DIAGNOSIS — Z0000 Encounter for general adult medical examination without abnormal findings: Secondary | ICD-10-CM | POA: Diagnosis not present

## 2020-06-03 DIAGNOSIS — R739 Hyperglycemia, unspecified: Secondary | ICD-10-CM | POA: Diagnosis not present

## 2020-06-16 DIAGNOSIS — L820 Inflamed seborrheic keratosis: Secondary | ICD-10-CM | POA: Diagnosis not present

## 2020-06-16 DIAGNOSIS — L578 Other skin changes due to chronic exposure to nonionizing radiation: Secondary | ICD-10-CM | POA: Diagnosis not present

## 2020-06-16 DIAGNOSIS — L821 Other seborrheic keratosis: Secondary | ICD-10-CM | POA: Diagnosis not present

## 2020-06-16 DIAGNOSIS — D485 Neoplasm of uncertain behavior of skin: Secondary | ICD-10-CM | POA: Diagnosis not present

## 2020-06-29 IMAGING — CR CHEST - 2 VIEW
2 series · 2 of 2 positions shown · non-contrast
Comparison: Chest x-ray dated 05/18/2019.

CLINICAL DATA: Chest pain due to myocardial ischemia.

EXAM:
CHEST - 2 VIEW

[chest pa]
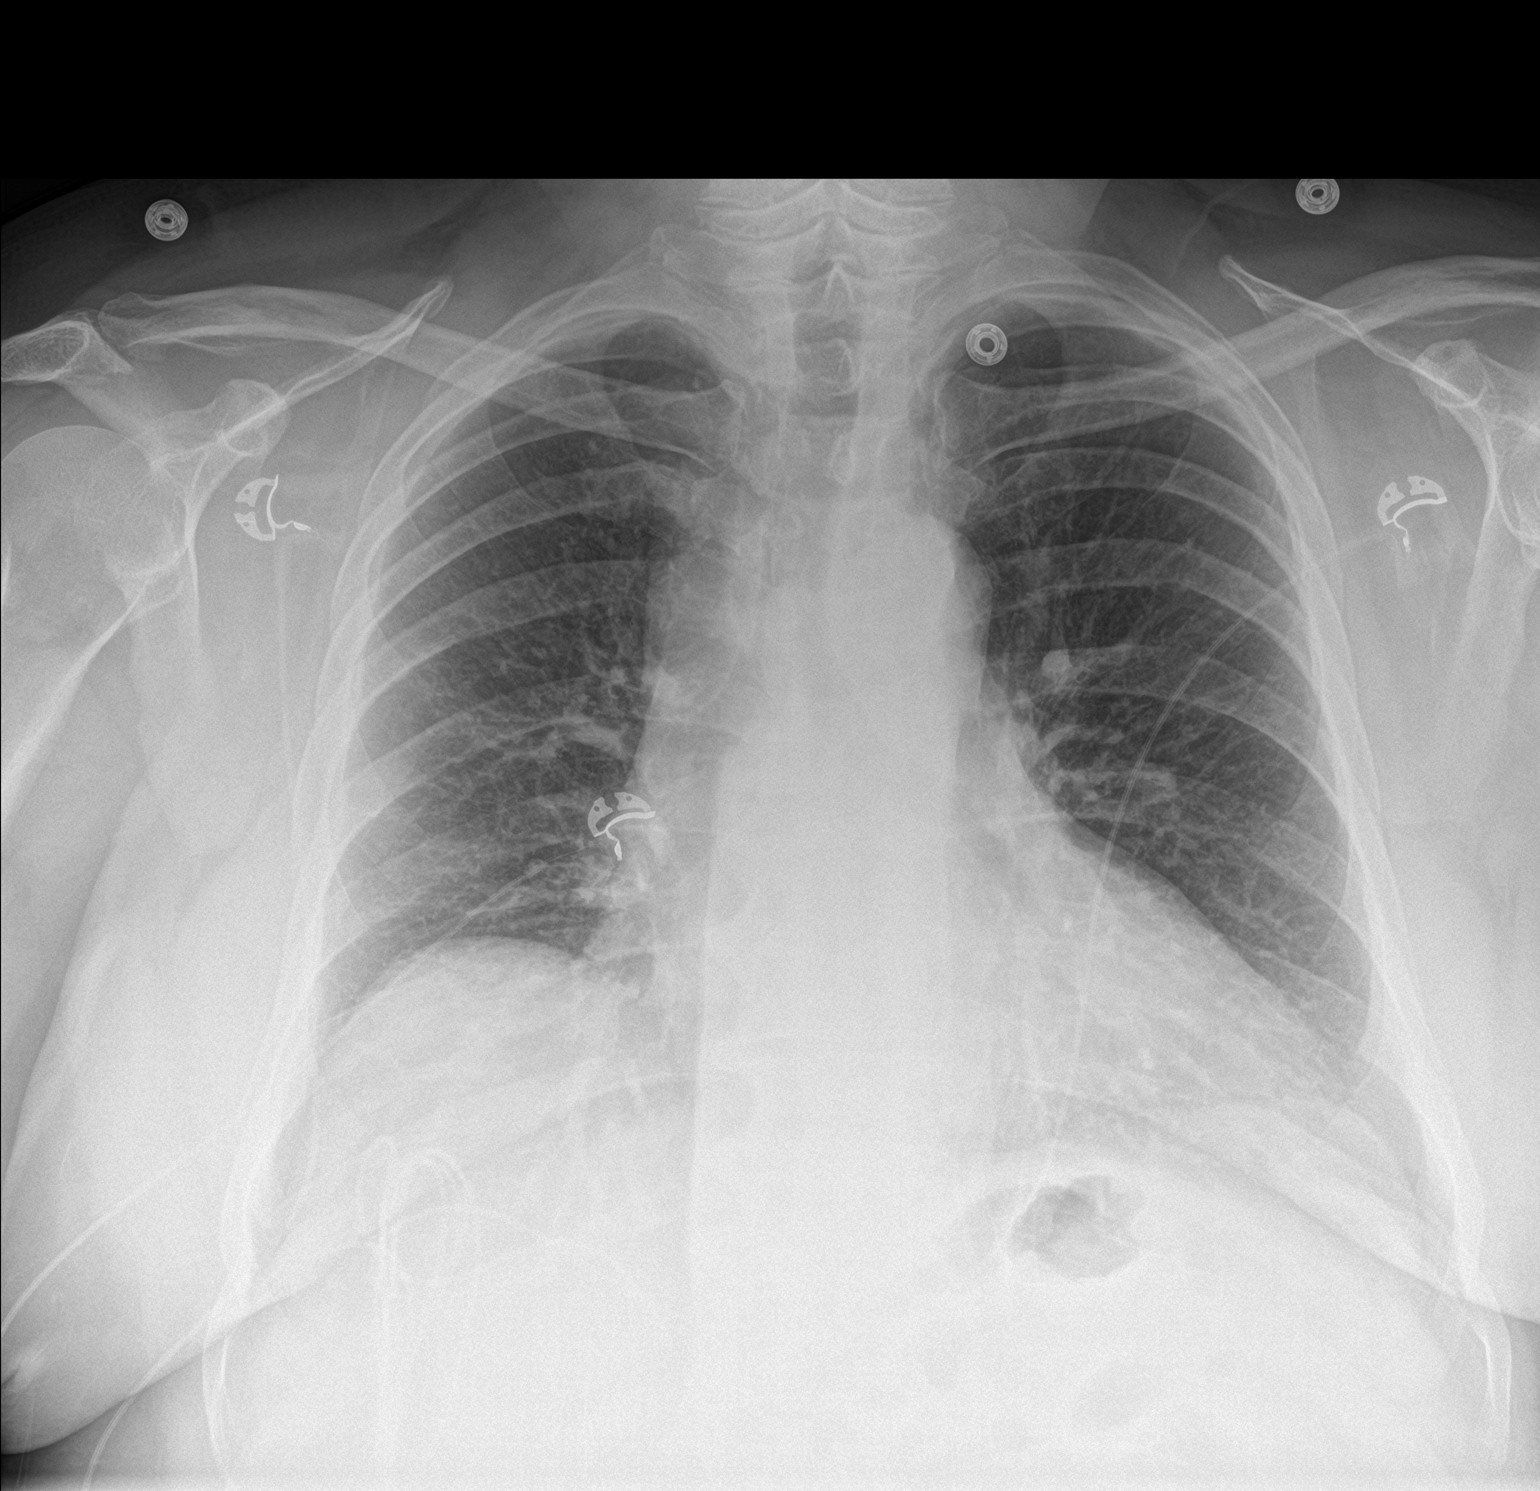

[chest lat]
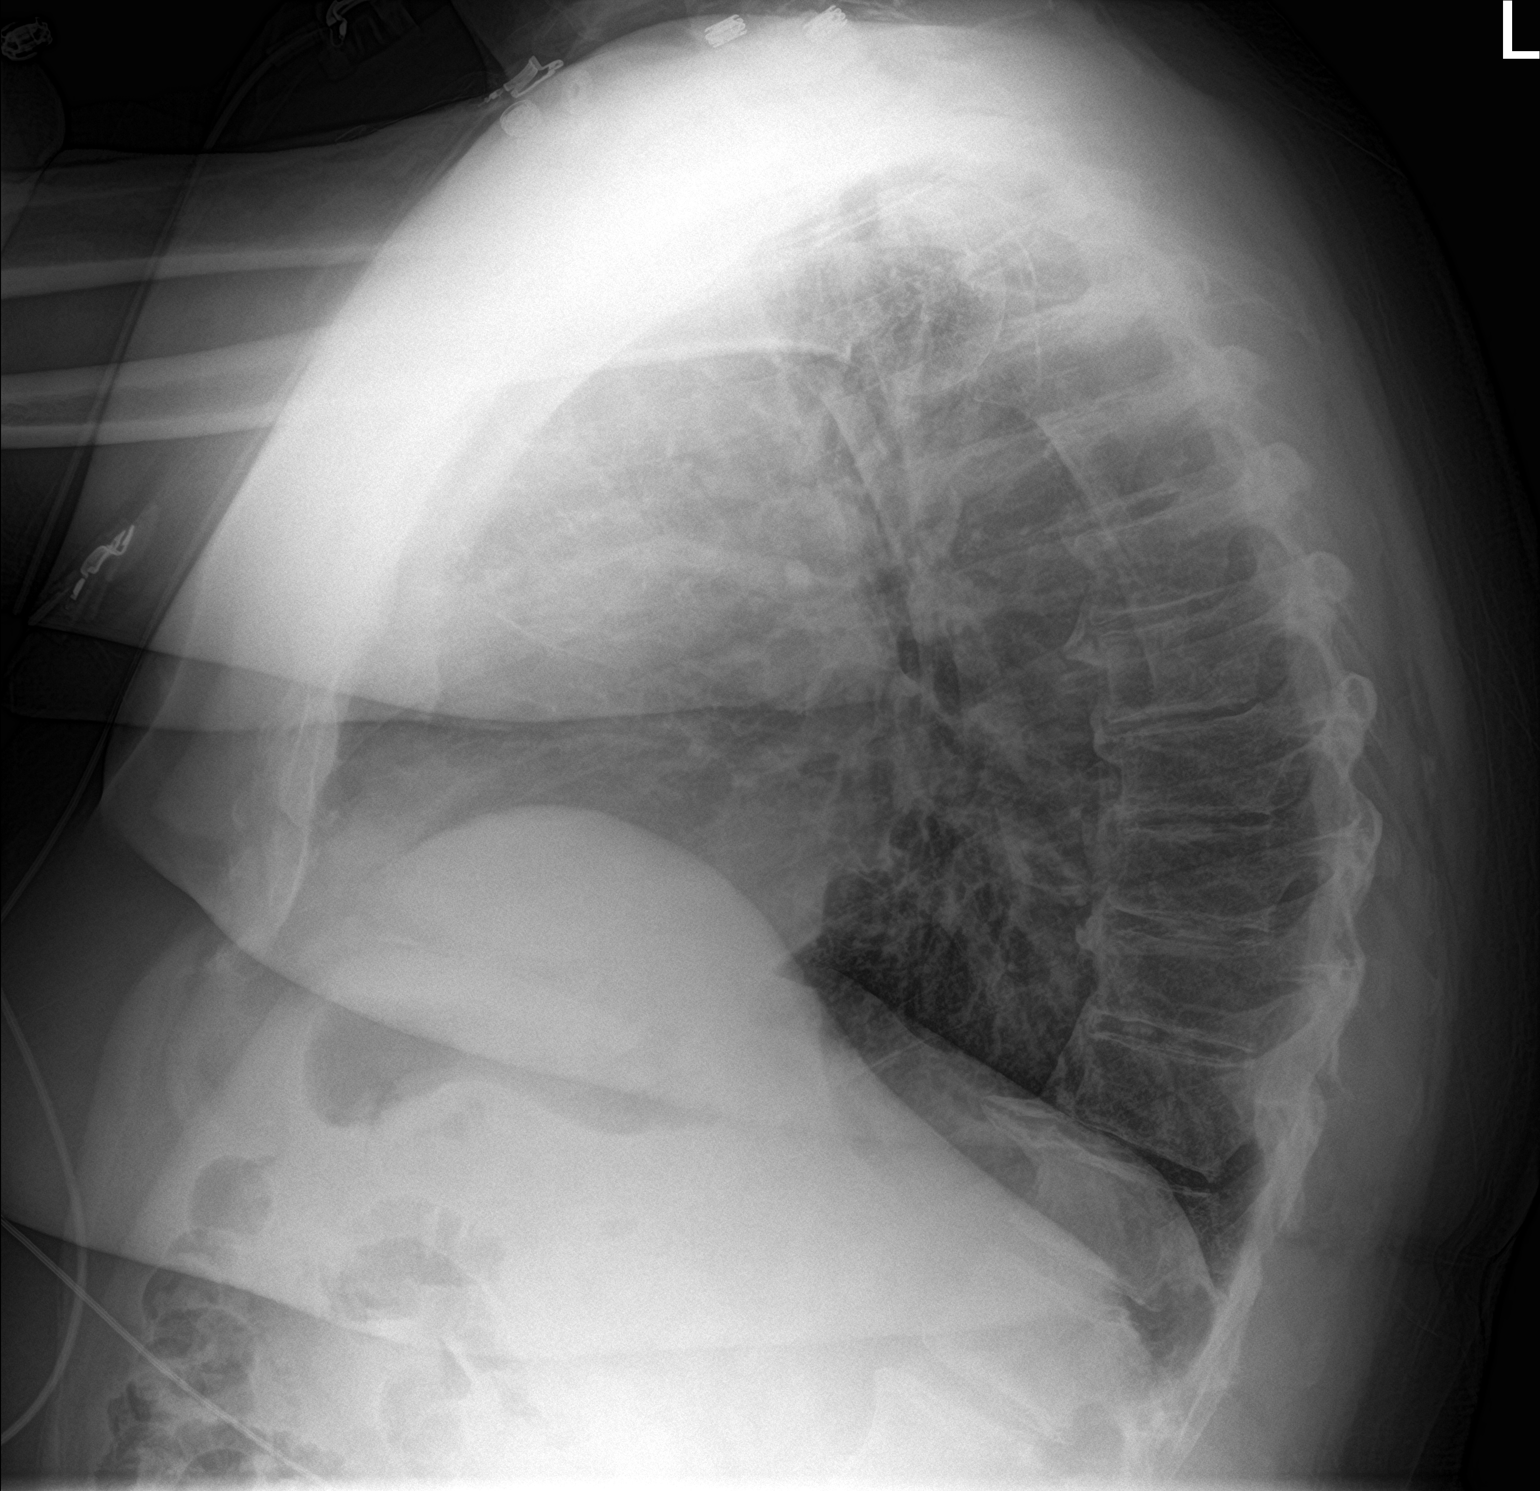

[2 of 2 positions shown; findings below may reference images not displayed]

FINDINGS: Borderline cardiomegaly, stable. Overall cardiomediastinal
silhouette is stable in size and configuration. Lungs are clear. No
pleural effusion or pneumothorax seen. Mild degenerative spondylosis
of the kyphotic thoracic spine. No acute or suspicious osseous
finding.
IMPRESSION: No active cardiopulmonary disease. No evidence of pneumonia or
pulmonary edema.

## 2020-07-01 IMAGING — DX PORTABLE CHEST - 1 VIEW
1 series · 1 of 1 positions shown · non-contrast
Comparison: Radiographs May 23, 2019.

CLINICAL DATA: Status post coronary artery bypass graft.

EXAM:
PORTABLE CHEST 1 VIEW

[chest ap]
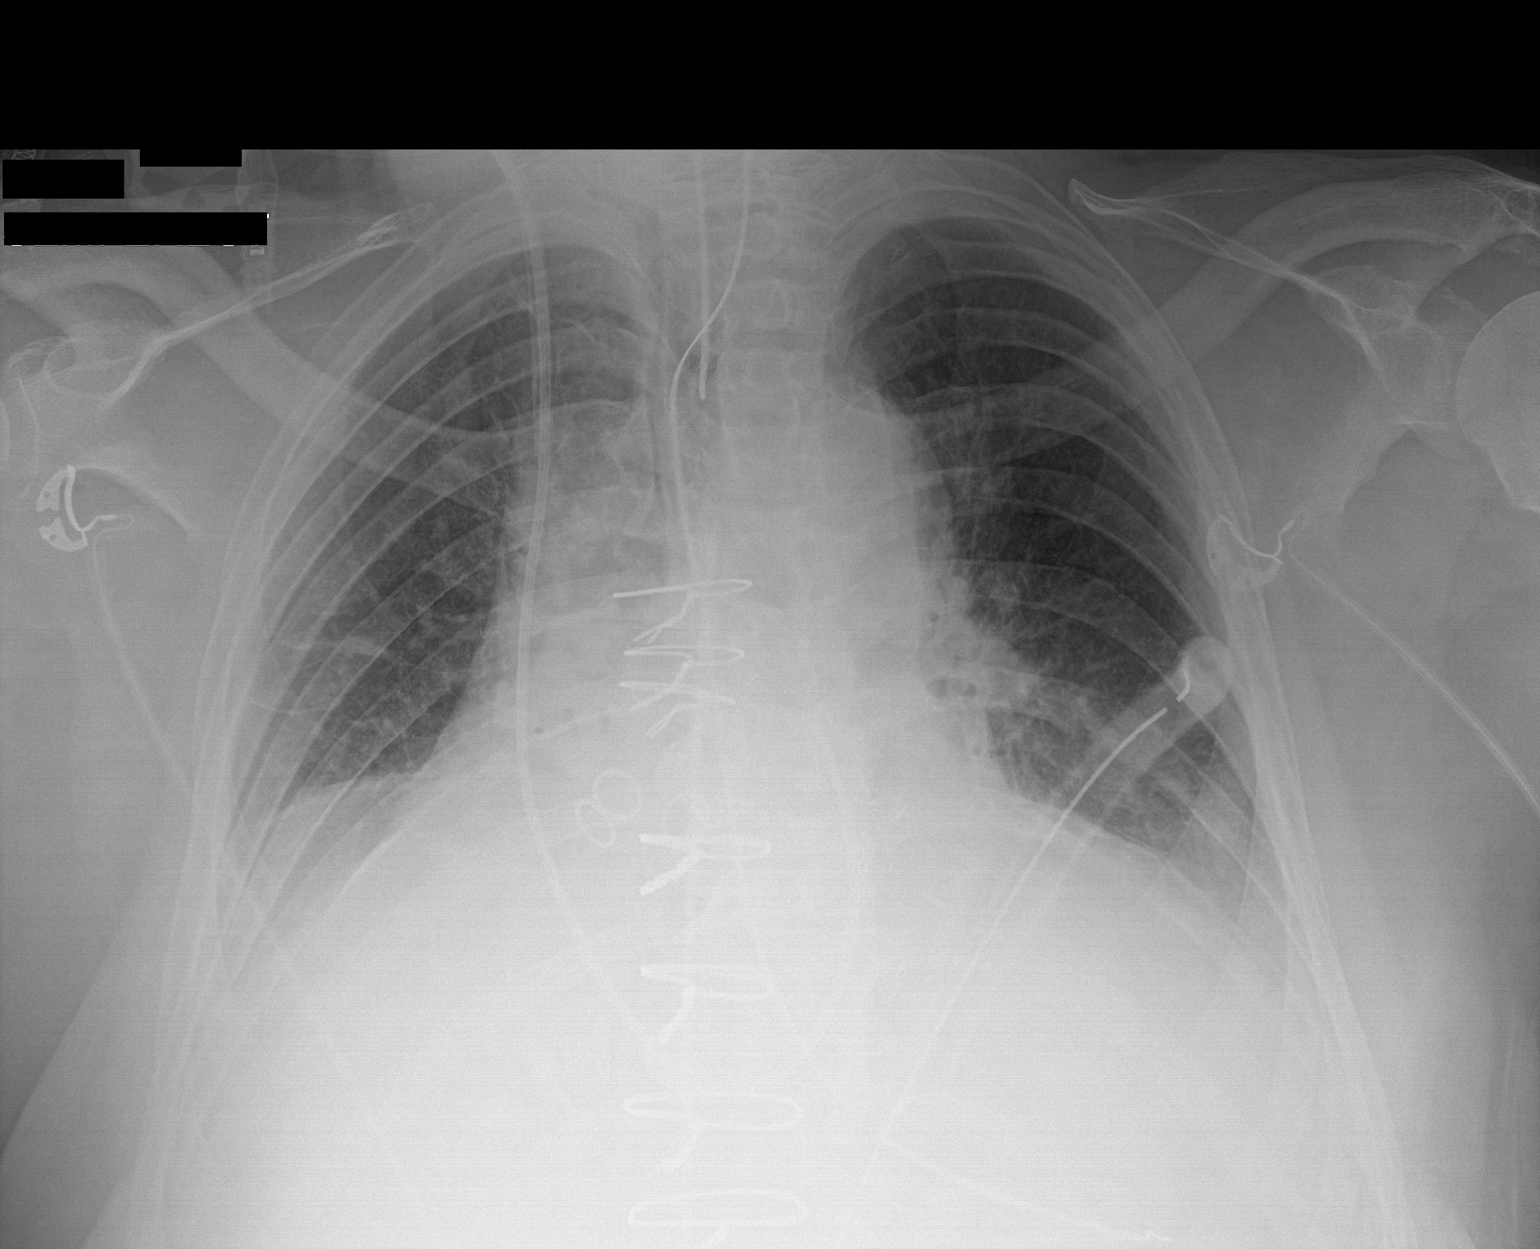

[1 of 1 positions shown; findings below may reference images not displayed]

FINDINGS: Endotracheal and nasogastric tubes are in grossly good position.
Left-sided chest tube is noted without pneumothorax. Right internal
jugular Swan-Ganz catheter is noted with distal tip in expected
position of main pulmonary artery. Bibasilar atelectasis and
effusions are noted. Bony thorax is unremarkable.
IMPRESSION: Endotracheal and nasogastric tubes in good position. Left-sided
chest tube is noted without pneumothorax. Bibasilar atelectasis and
effusions are noted

## 2020-07-02 IMAGING — DX PORTABLE CHEST - 1 VIEW
1 series · 1 of 1 positions shown · non-contrast
Comparison: 05/25/2019.

CLINICAL DATA: Status post CABG.

EXAM:
PORTABLE CHEST 1 VIEW

[chest ap]
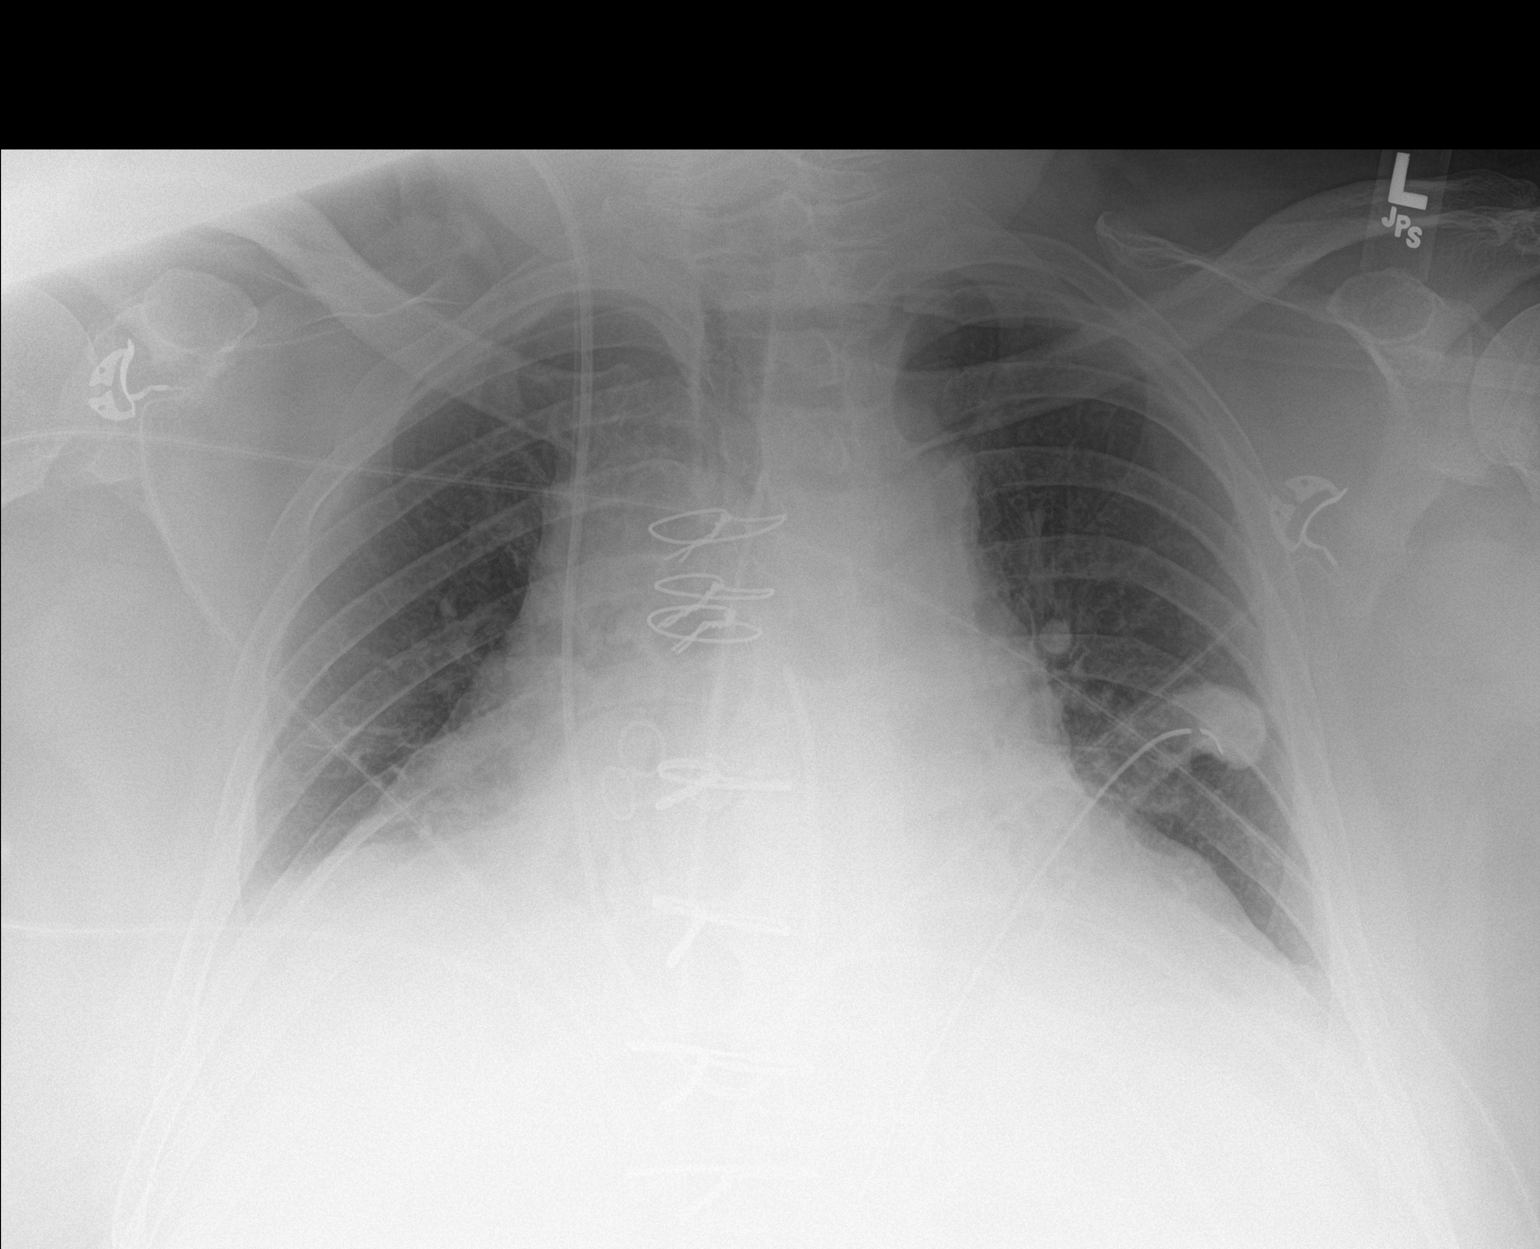

[1 of 1 positions shown; findings below may reference images not displayed]

FINDINGS: Interim extubation and removal of NG tube. Mediastinal drainage
catheter, Swan-Ganz catheter, left chest tube in stable position.
Prior CABG. Cardiomegaly. Bibasilar atelectasis/infiltrates. No
pleural effusion or pneumothorax.
IMPRESSION: 1. Interim extubation removal of NG tube. Mediastinal drainage
catheter, Swan-Ganz catheter, left chest tube in stable position. No
pneumothorax.

2. Persistent bibasilar atelectasis/infiltrates. No pleural effusion
noted on today's exam.

3.  Prior CABG.  Stable cardiomegaly.

## 2020-07-03 IMAGING — DX PORTABLE CHEST - 1 VIEW
1 series · 1 of 1 positions shown · non-contrast
Comparison: Portable exam 8233 hours compared to 05/26/2019

CLINICAL DATA: Post CABG

EXAM:
PORTABLE CHEST 1 VIEW

[chest ap]
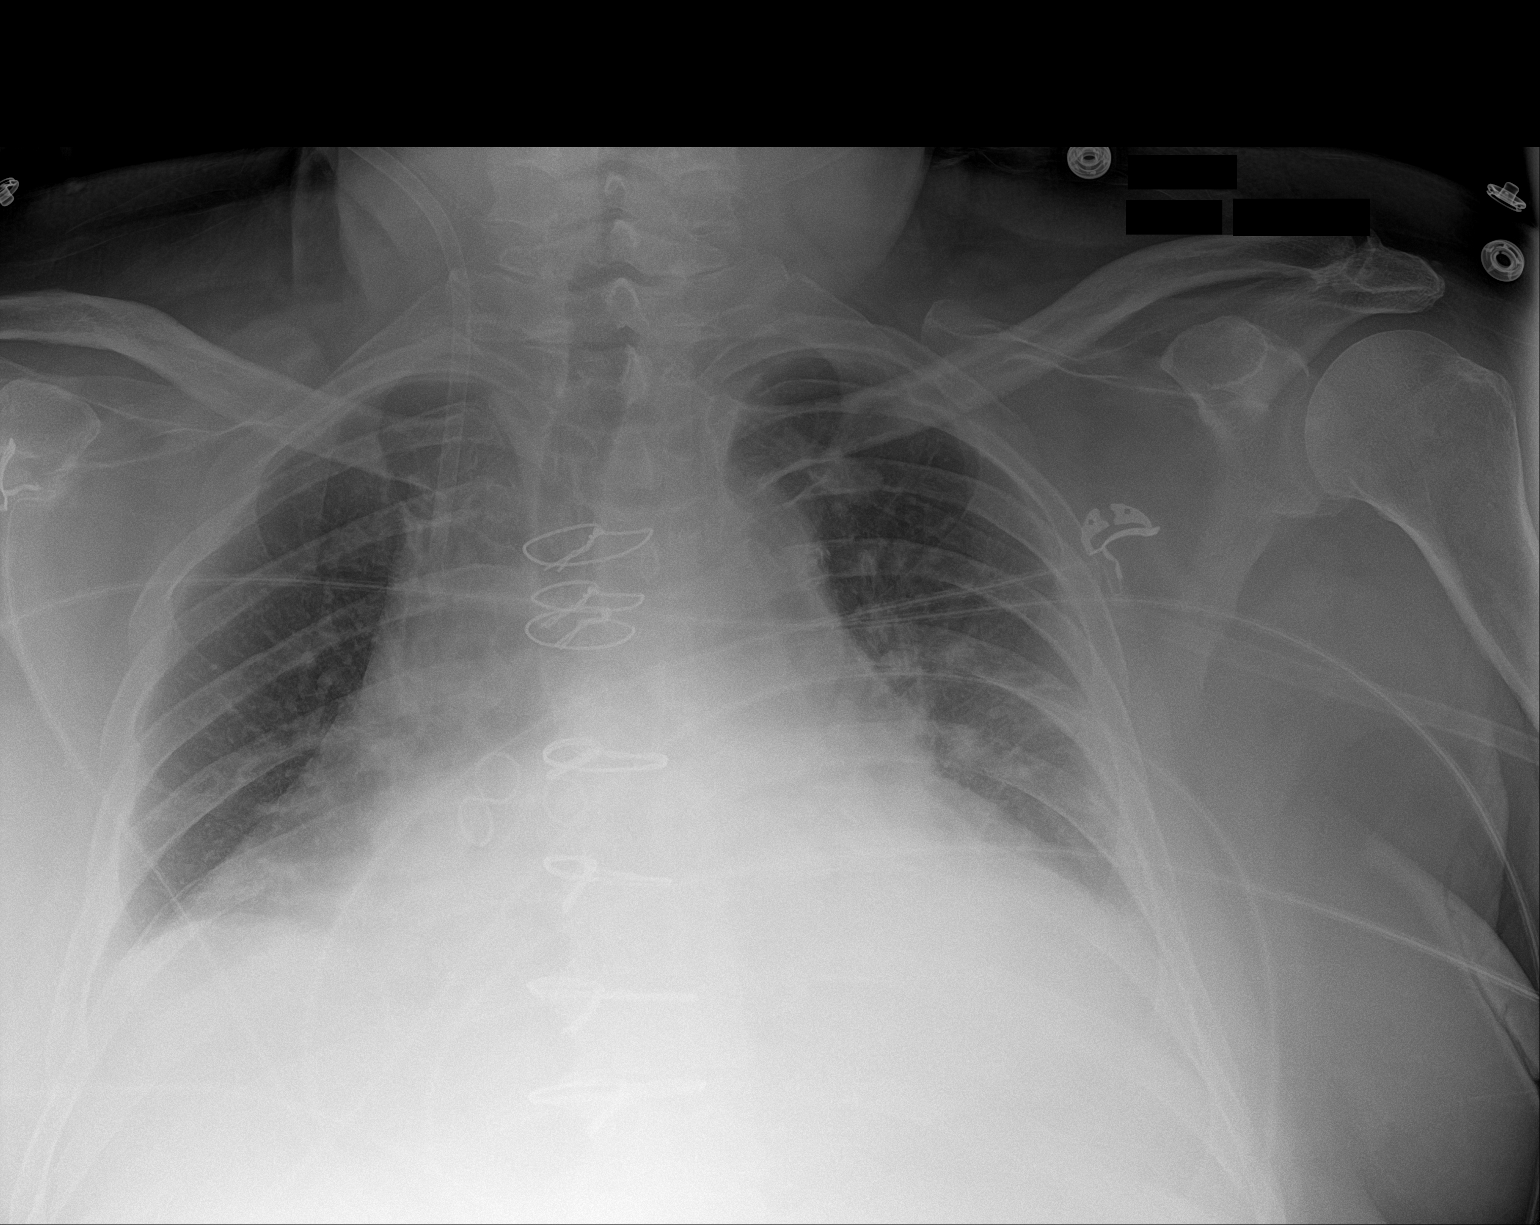

[1 of 1 positions shown; findings below may reference images not displayed]

FINDINGS: Swan-Ganz catheter and mediastinal drain removed.

RIGHT jugular central venous catheter tip projecting over SVC.

Enlargement of cardiac silhouette post CABG.

Mediastinal contours and pulmonary vascularity normal.

Bibasilar atelectasis.

No pleural effusion or pneumothorax.
IMPRESSION: Enlargement of cardiac silhouette post CABG.

Bibasilar atelectasis.

## 2020-07-12 ENCOUNTER — Other Ambulatory Visit: Payer: Self-pay | Admitting: Cardiology

## 2020-07-12 DIAGNOSIS — I209 Angina pectoris, unspecified: Secondary | ICD-10-CM

## 2020-07-24 DIAGNOSIS — J069 Acute upper respiratory infection, unspecified: Secondary | ICD-10-CM | POA: Diagnosis not present

## 2020-07-24 DIAGNOSIS — Z20828 Contact with and (suspected) exposure to other viral communicable diseases: Secondary | ICD-10-CM | POA: Diagnosis not present

## 2020-07-24 DIAGNOSIS — H6501 Acute serous otitis media, right ear: Secondary | ICD-10-CM | POA: Diagnosis not present

## 2020-09-26 DIAGNOSIS — I480 Paroxysmal atrial fibrillation: Secondary | ICD-10-CM | POA: Diagnosis not present

## 2020-09-26 DIAGNOSIS — Z951 Presence of aortocoronary bypass graft: Secondary | ICD-10-CM | POA: Diagnosis not present

## 2020-09-26 DIAGNOSIS — E785 Hyperlipidemia, unspecified: Secondary | ICD-10-CM | POA: Diagnosis not present

## 2020-09-26 DIAGNOSIS — I2510 Atherosclerotic heart disease of native coronary artery without angina pectoris: Secondary | ICD-10-CM | POA: Diagnosis not present

## 2020-10-03 DIAGNOSIS — Z1212 Encounter for screening for malignant neoplasm of rectum: Secondary | ICD-10-CM | POA: Diagnosis not present

## 2020-10-03 DIAGNOSIS — Z1211 Encounter for screening for malignant neoplasm of colon: Secondary | ICD-10-CM | POA: Diagnosis not present

## 2020-10-17 LAB — EXTERNAL GENERIC LAB PROCEDURE: COLOGUARD: NEGATIVE

## 2021-01-20 DIAGNOSIS — I2510 Atherosclerotic heart disease of native coronary artery without angina pectoris: Secondary | ICD-10-CM | POA: Diagnosis not present

## 2021-01-20 DIAGNOSIS — E785 Hyperlipidemia, unspecified: Secondary | ICD-10-CM | POA: Diagnosis not present

## 2021-01-20 DIAGNOSIS — I480 Paroxysmal atrial fibrillation: Secondary | ICD-10-CM | POA: Diagnosis not present

## 2021-01-23 DIAGNOSIS — E538 Deficiency of other specified B group vitamins: Secondary | ICD-10-CM | POA: Diagnosis not present

## 2021-01-23 DIAGNOSIS — I10 Essential (primary) hypertension: Secondary | ICD-10-CM | POA: Diagnosis not present

## 2021-01-23 DIAGNOSIS — E785 Hyperlipidemia, unspecified: Secondary | ICD-10-CM | POA: Diagnosis not present

## 2021-03-02 DIAGNOSIS — E785 Hyperlipidemia, unspecified: Secondary | ICD-10-CM | POA: Diagnosis not present

## 2021-08-18 DIAGNOSIS — I480 Paroxysmal atrial fibrillation: Secondary | ICD-10-CM | POA: Diagnosis not present

## 2021-08-18 DIAGNOSIS — E785 Hyperlipidemia, unspecified: Secondary | ICD-10-CM | POA: Diagnosis not present

## 2021-08-18 DIAGNOSIS — I2510 Atherosclerotic heart disease of native coronary artery without angina pectoris: Secondary | ICD-10-CM | POA: Diagnosis not present

## 2021-08-18 DIAGNOSIS — Z951 Presence of aortocoronary bypass graft: Secondary | ICD-10-CM | POA: Diagnosis not present

## 2022-03-23 DIAGNOSIS — E785 Hyperlipidemia, unspecified: Secondary | ICD-10-CM | POA: Diagnosis not present

## 2022-03-23 DIAGNOSIS — Z951 Presence of aortocoronary bypass graft: Secondary | ICD-10-CM | POA: Diagnosis not present

## 2022-03-23 DIAGNOSIS — I480 Paroxysmal atrial fibrillation: Secondary | ICD-10-CM | POA: Diagnosis not present

## 2022-07-17 DIAGNOSIS — H25812 Combined forms of age-related cataract, left eye: Secondary | ICD-10-CM | POA: Diagnosis not present

## 2022-07-17 DIAGNOSIS — Z961 Presence of intraocular lens: Secondary | ICD-10-CM | POA: Diagnosis not present

## 2022-08-07 DIAGNOSIS — Z961 Presence of intraocular lens: Secondary | ICD-10-CM | POA: Diagnosis not present

## 2022-08-07 DIAGNOSIS — H269 Unspecified cataract: Secondary | ICD-10-CM | POA: Diagnosis not present

## 2022-08-10 DIAGNOSIS — R051 Acute cough: Secondary | ICD-10-CM | POA: Diagnosis not present

## 2022-08-10 DIAGNOSIS — J069 Acute upper respiratory infection, unspecified: Secondary | ICD-10-CM | POA: Diagnosis not present

## 2022-08-22 DIAGNOSIS — J019 Acute sinusitis, unspecified: Secondary | ICD-10-CM | POA: Diagnosis not present

## 2022-10-26 DIAGNOSIS — I480 Paroxysmal atrial fibrillation: Secondary | ICD-10-CM | POA: Diagnosis not present

## 2022-10-26 DIAGNOSIS — Z951 Presence of aortocoronary bypass graft: Secondary | ICD-10-CM | POA: Diagnosis not present

## 2022-10-26 DIAGNOSIS — E785 Hyperlipidemia, unspecified: Secondary | ICD-10-CM | POA: Diagnosis not present

## 2022-10-26 DIAGNOSIS — I2510 Atherosclerotic heart disease of native coronary artery without angina pectoris: Secondary | ICD-10-CM | POA: Diagnosis not present

## 2023-06-05 DIAGNOSIS — J309 Allergic rhinitis, unspecified: Secondary | ICD-10-CM | POA: Diagnosis not present

## 2023-06-05 DIAGNOSIS — R051 Acute cough: Secondary | ICD-10-CM | POA: Diagnosis not present

## 2023-06-05 DIAGNOSIS — R0981 Nasal congestion: Secondary | ICD-10-CM | POA: Diagnosis not present

## 2023-06-11 DIAGNOSIS — R051 Acute cough: Secondary | ICD-10-CM | POA: Diagnosis not present

## 2023-06-11 DIAGNOSIS — R0981 Nasal congestion: Secondary | ICD-10-CM | POA: Diagnosis not present

## 2023-06-11 DIAGNOSIS — R062 Wheezing: Secondary | ICD-10-CM | POA: Diagnosis not present

## 2023-07-22 DIAGNOSIS — H903 Sensorineural hearing loss, bilateral: Secondary | ICD-10-CM | POA: Diagnosis not present

## 2023-08-19 DIAGNOSIS — I2510 Atherosclerotic heart disease of native coronary artery without angina pectoris: Secondary | ICD-10-CM | POA: Diagnosis not present

## 2023-08-19 DIAGNOSIS — E785 Hyperlipidemia, unspecified: Secondary | ICD-10-CM | POA: Diagnosis not present

## 2023-08-19 DIAGNOSIS — Z951 Presence of aortocoronary bypass graft: Secondary | ICD-10-CM | POA: Diagnosis not present

## 2023-08-19 DIAGNOSIS — I480 Paroxysmal atrial fibrillation: Secondary | ICD-10-CM | POA: Diagnosis not present

## 2023-10-31 DIAGNOSIS — Z20822 Contact with and (suspected) exposure to covid-19: Secondary | ICD-10-CM | POA: Diagnosis not present

## 2023-10-31 DIAGNOSIS — I10 Essential (primary) hypertension: Secondary | ICD-10-CM | POA: Diagnosis not present

## 2023-10-31 DIAGNOSIS — Z79899 Other long term (current) drug therapy: Secondary | ICD-10-CM | POA: Diagnosis not present

## 2023-10-31 DIAGNOSIS — R531 Weakness: Secondary | ICD-10-CM | POA: Diagnosis not present

## 2023-10-31 DIAGNOSIS — R9431 Abnormal electrocardiogram [ECG] [EKG]: Secondary | ICD-10-CM | POA: Diagnosis not present

## 2023-10-31 DIAGNOSIS — M7989 Other specified soft tissue disorders: Secondary | ICD-10-CM | POA: Diagnosis not present

## 2023-10-31 DIAGNOSIS — R0602 Shortness of breath: Secondary | ICD-10-CM | POA: Diagnosis not present

## 2023-10-31 DIAGNOSIS — R4182 Altered mental status, unspecified: Secondary | ICD-10-CM | POA: Diagnosis not present

## 2023-10-31 DIAGNOSIS — J189 Pneumonia, unspecified organism: Secondary | ICD-10-CM | POA: Diagnosis not present

## 2023-11-02 DIAGNOSIS — I2694 Multiple subsegmental pulmonary emboli without acute cor pulmonale: Secondary | ICD-10-CM | POA: Diagnosis not present

## 2023-11-02 DIAGNOSIS — Z7982 Long term (current) use of aspirin: Secondary | ICD-10-CM | POA: Diagnosis not present

## 2023-11-02 DIAGNOSIS — Z79899 Other long term (current) drug therapy: Secondary | ICD-10-CM | POA: Diagnosis not present

## 2023-11-02 DIAGNOSIS — I361 Nonrheumatic tricuspid (valve) insufficiency: Secondary | ICD-10-CM | POA: Diagnosis not present

## 2023-11-02 DIAGNOSIS — Z7901 Long term (current) use of anticoagulants: Secondary | ICD-10-CM | POA: Diagnosis not present

## 2023-11-02 DIAGNOSIS — I340 Nonrheumatic mitral (valve) insufficiency: Secondary | ICD-10-CM | POA: Diagnosis not present

## 2023-11-02 DIAGNOSIS — R0609 Other forms of dyspnea: Secondary | ICD-10-CM | POA: Diagnosis not present

## 2023-11-02 DIAGNOSIS — K219 Gastro-esophageal reflux disease without esophagitis: Secondary | ICD-10-CM | POA: Diagnosis not present

## 2023-11-02 DIAGNOSIS — M7989 Other specified soft tissue disorders: Secondary | ICD-10-CM | POA: Diagnosis not present

## 2023-11-02 DIAGNOSIS — E785 Hyperlipidemia, unspecified: Secondary | ICD-10-CM | POA: Diagnosis not present

## 2023-11-02 DIAGNOSIS — R531 Weakness: Secondary | ICD-10-CM | POA: Diagnosis not present

## 2023-11-02 DIAGNOSIS — Z885 Allergy status to narcotic agent status: Secondary | ICD-10-CM | POA: Diagnosis not present

## 2023-11-02 DIAGNOSIS — I2693 Single subsegmental pulmonary embolism without acute cor pulmonale: Secondary | ICD-10-CM | POA: Diagnosis not present

## 2023-11-02 DIAGNOSIS — G610 Guillain-Barre syndrome: Secondary | ICD-10-CM | POA: Diagnosis not present

## 2023-11-02 DIAGNOSIS — R0602 Shortness of breath: Secondary | ICD-10-CM | POA: Diagnosis not present

## 2023-11-02 DIAGNOSIS — J9601 Acute respiratory failure with hypoxia: Secondary | ICD-10-CM | POA: Diagnosis not present

## 2023-11-02 DIAGNOSIS — I10 Essential (primary) hypertension: Secondary | ICD-10-CM | POA: Diagnosis not present

## 2023-11-02 DIAGNOSIS — Z951 Presence of aortocoronary bypass graft: Secondary | ICD-10-CM | POA: Diagnosis not present

## 2023-11-02 DIAGNOSIS — I2510 Atherosclerotic heart disease of native coronary artery without angina pectoris: Secondary | ICD-10-CM | POA: Diagnosis not present

## 2023-11-02 DIAGNOSIS — R9431 Abnormal electrocardiogram [ECG] [EKG]: Secondary | ICD-10-CM | POA: Diagnosis not present

## 2023-11-02 DIAGNOSIS — R4182 Altered mental status, unspecified: Secondary | ICD-10-CM | POA: Diagnosis not present

## 2023-11-02 DIAGNOSIS — M6282 Rhabdomyolysis: Secondary | ICD-10-CM | POA: Diagnosis not present

## 2023-11-03 DIAGNOSIS — I340 Nonrheumatic mitral (valve) insufficiency: Secondary | ICD-10-CM | POA: Diagnosis not present

## 2023-11-03 DIAGNOSIS — I361 Nonrheumatic tricuspid (valve) insufficiency: Secondary | ICD-10-CM | POA: Diagnosis not present

## 2023-11-07 ENCOUNTER — Ambulatory Visit: Payer: PPO | Admitting: Podiatry

## 2023-11-13 DIAGNOSIS — D692 Other nonthrombocytopenic purpura: Secondary | ICD-10-CM | POA: Diagnosis not present

## 2023-11-13 DIAGNOSIS — K219 Gastro-esophageal reflux disease without esophagitis: Secondary | ICD-10-CM | POA: Diagnosis not present

## 2023-11-13 DIAGNOSIS — G610 Guillain-Barre syndrome: Secondary | ICD-10-CM | POA: Diagnosis not present

## 2023-11-13 DIAGNOSIS — I10 Essential (primary) hypertension: Secondary | ICD-10-CM | POA: Diagnosis not present

## 2023-11-13 DIAGNOSIS — R5383 Other fatigue: Secondary | ICD-10-CM | POA: Diagnosis not present

## 2023-11-13 DIAGNOSIS — I2699 Other pulmonary embolism without acute cor pulmonale: Secondary | ICD-10-CM | POA: Diagnosis not present

## 2023-11-13 DIAGNOSIS — Z6836 Body mass index (BMI) 36.0-36.9, adult: Secondary | ICD-10-CM | POA: Diagnosis not present

## 2023-11-13 DIAGNOSIS — M159 Polyosteoarthritis, unspecified: Secondary | ICD-10-CM | POA: Diagnosis not present

## 2023-11-18 DIAGNOSIS — R2681 Unsteadiness on feet: Secondary | ICD-10-CM | POA: Diagnosis not present

## 2023-11-18 DIAGNOSIS — M545 Low back pain, unspecified: Secondary | ICD-10-CM | POA: Diagnosis not present

## 2023-11-18 DIAGNOSIS — M6281 Muscle weakness (generalized): Secondary | ICD-10-CM | POA: Diagnosis not present

## 2023-11-18 DIAGNOSIS — M2560 Stiffness of unspecified joint, not elsewhere classified: Secondary | ICD-10-CM | POA: Diagnosis not present

## 2023-11-18 DIAGNOSIS — G610 Guillain-Barre syndrome: Secondary | ICD-10-CM | POA: Diagnosis not present

## 2023-11-21 ENCOUNTER — Encounter: Payer: Self-pay | Admitting: Neurology

## 2023-11-28 DIAGNOSIS — M2560 Stiffness of unspecified joint, not elsewhere classified: Secondary | ICD-10-CM | POA: Diagnosis not present

## 2023-11-28 DIAGNOSIS — M6281 Muscle weakness (generalized): Secondary | ICD-10-CM | POA: Diagnosis not present

## 2023-11-28 DIAGNOSIS — G610 Guillain-Barre syndrome: Secondary | ICD-10-CM | POA: Diagnosis not present

## 2023-11-28 DIAGNOSIS — M545 Low back pain, unspecified: Secondary | ICD-10-CM | POA: Diagnosis not present

## 2023-11-28 DIAGNOSIS — R2681 Unsteadiness on feet: Secondary | ICD-10-CM | POA: Diagnosis not present

## 2023-12-04 DIAGNOSIS — I10 Essential (primary) hypertension: Secondary | ICD-10-CM | POA: Diagnosis not present

## 2023-12-04 DIAGNOSIS — D692 Other nonthrombocytopenic purpura: Secondary | ICD-10-CM | POA: Diagnosis not present

## 2023-12-04 DIAGNOSIS — M159 Polyosteoarthritis, unspecified: Secondary | ICD-10-CM | POA: Diagnosis not present

## 2023-12-04 DIAGNOSIS — Z6836 Body mass index (BMI) 36.0-36.9, adult: Secondary | ICD-10-CM | POA: Diagnosis not present

## 2023-12-04 DIAGNOSIS — G610 Guillain-Barre syndrome: Secondary | ICD-10-CM | POA: Diagnosis not present

## 2023-12-04 DIAGNOSIS — K219 Gastro-esophageal reflux disease without esophagitis: Secondary | ICD-10-CM | POA: Diagnosis not present

## 2023-12-04 DIAGNOSIS — I2699 Other pulmonary embolism without acute cor pulmonale: Secondary | ICD-10-CM | POA: Diagnosis not present

## 2023-12-31 ENCOUNTER — Ambulatory Visit: Payer: PPO | Admitting: Neurology

## 2023-12-31 DIAGNOSIS — R278 Other lack of coordination: Secondary | ICD-10-CM | POA: Diagnosis not present

## 2023-12-31 DIAGNOSIS — G610 Guillain-Barre syndrome: Secondary | ICD-10-CM | POA: Diagnosis not present

## 2023-12-31 DIAGNOSIS — M6281 Muscle weakness (generalized): Secondary | ICD-10-CM | POA: Diagnosis not present

## 2024-01-20 ENCOUNTER — Ambulatory Visit: Payer: PPO | Admitting: Podiatry

## 2024-01-21 ENCOUNTER — Encounter: Payer: Self-pay | Admitting: Podiatry

## 2024-01-21 ENCOUNTER — Telehealth: Payer: Self-pay | Admitting: Podiatry

## 2024-01-21 ENCOUNTER — Ambulatory Visit: Payer: PPO | Admitting: Podiatry

## 2024-01-21 ENCOUNTER — Ambulatory Visit (INDEPENDENT_AMBULATORY_CARE_PROVIDER_SITE_OTHER): Payer: PPO

## 2024-01-21 DIAGNOSIS — M216X1 Other acquired deformities of right foot: Secondary | ICD-10-CM

## 2024-01-21 DIAGNOSIS — M7741 Metatarsalgia, right foot: Secondary | ICD-10-CM

## 2024-01-21 DIAGNOSIS — M216X2 Other acquired deformities of left foot: Secondary | ICD-10-CM | POA: Diagnosis not present

## 2024-01-21 DIAGNOSIS — G610 Guillain-Barre syndrome: Secondary | ICD-10-CM

## 2024-01-21 DIAGNOSIS — M7742 Metatarsalgia, left foot: Secondary | ICD-10-CM

## 2024-01-21 MED ORDER — DICLOFENAC SODIUM 75 MG PO TBEC: 75.0000 mg | DELAYED_RELEASE_TABLET | Freq: Two times a day (BID) | ORAL | 0 refills | Status: DC

## 2024-01-21 NOTE — Progress Notes (Unsigned)
 Chief Complaint  Patient presents with   Swelling    NP, bilateral swelling of feet and ankles. 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 He has been in the hospital. There is pain across the balls of the feet at the toes. Pain started a week ago. He does go to PT currently. Dr. Samuella Cota gave him some met pads which did seem to help some. Not diabetic, takes Elliquis and ASA 81.     HPI: 78 y.o. male presents today with bilateral right forefoot pain.  Patient was recently diagnosed with Guillain-Barr syndrome and had recently been hospitalized.  He has been undergoing rehab, strength has been improving somewhat.  States that this has altered the way he walks.  As he has increased his weightbearing activity he has noticed worsening pain to bilateral forefoot region.  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 pLAD 30% & mLAD 50%, p-mCx 40% - OM1 90%, OM2 95%; pRCA 95% (ulcerated plaque).  EF roughly 50% with small focal inferior hypokinesis. 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 Maren Beach): LIMA-LAD, SVG-RI (OM1), SVG-LPL, SVG-rPDA)    Past Surgical History:  Procedure Laterality Date   CATARACT EXTRACTION W/ INTRAOCULAR LENS IMPLANT     CORONARY ARTERY BYPASS GRAFT N/A 05/25/2019   Procedure: CORONARY ARTERY BYPASS GRAFTING (CABG) times four using left internal mammary artery and right leg saphenous vein;  Surgeon: Kerin Perna, MD;  Location: [facility] OR;  Service: Open Heart Surgery;  Laterality: N/A;   HAND SURGERY     HERNIA REPAIR  1999   LEFT HEART CATH AND CORONARY ANGIOGRAPHY N/A 05/22/2019   Procedure: LEFT HEART CATH AND CORONARY ANGIOGRAPHY;  Surgeon: Lennette Bihari, MD;  Location: [facility] INVASIVE CV LAB;  Service: Cardiovascular;  Laterality: N/A;   TEE WITHOUT CARDIOVERSION N/A 05/25/2019   Procedure:  TRANSESOPHAGEAL ECHOCARDIOGRAM (TEE);  Surgeon: Donata Clay, Theron Arista, MD;  Location: [facility] OR;  Service: Open Heart Surgery;  Laterality: N/A;    Allergies  Allergen Reactions   Codeine Other (See Comments)    Shaking, dizzy, lightheaded    ROS    Physical Exam: There were no vitals filed for this visit.  General: The patient is alert and oriented x3 in no acute distress.  Dermatology: Skin is warm, dry and supple bilateral lower extremities. Interspaces are clear of maceration and debris.  Pedal skin atrophic  Vascular: Palpable pedal pulses bilaterally. Capillary refill within normal limits.  +2-3 pitting edema present.  No erythema or calor.  Neurological: Light touch sensation grossly intact bilateral feet.  Decreased vibratory sensation.  Protective sensation intact.  Musculoskeletal Exam: Decreased ankle joint dorsiflexion less than 10 degrees bilaterally.  Diffuse pain on palpation of forefoot bilaterally.  Right somewhat worse than left.  A propulsive gait noted.  Muscle strength 5/5 in dorsiflexion, plantarflexion, inversion, eversion, with some decreased active range of motion.  Radiographic Exam: Left and right foot radiographs 01/21/2024 Normal osseous mineralization. Joint spaces preserved.  No fractures or osseous irregularities noted.  Assessment/Plan of Care: 1. Metatarsalgia of both feet   2. Acquired equinus deformity of both feet   3. Guillain Barr syndrome (HCC)      Meds ordered this encounter  Medications   DISCONTD: diclofenac (VOLTAREN)  75 MG EC tablet    Sig: Take 1 tablet (75 mg total) by mouth 2 (two) times daily.    Dispense:  30 tablet    Refill:  0   None  Discussed clinical findings with patient today.  Radiographs reviewed with patient  Suspect findings of general metatarsalgia due to newly increasing activity level as he recovers from Guillain-Barr syndrome.  Does have altered gait at this point with stiff heel cord likely increasing forefoot  pressure.  Metatarsal pads dispensed the patient.  Achilles stretching exercises discussed with patient and dispensed.  Initially prescribed diclofenac to patient, patient and wife want to avoid this due to potential for cardiovascular event.  Okay to take over-the-counter NSAIDs as tolerated.  Follow-up in 3 weeks for reevaluation  Sunya Humbarger L. Marchia Bond, AACFAS [facility]     [address] [place], [place] [address]                Office [phone]  Fax [phone]

## 2024-01-21 NOTE — Patient Instructions (Signed)

## 2024-01-21 NOTE — Telephone Encounter (Signed)
 Patient spouse called and stated Patient will not take medication prescribed by Dr. Jamse Arn due to warnings on medication/May raise risk of heart and blood vessel problems/Heart attack and stroke. Contact telephone number, [phone]

## 2024-01-22 NOTE — Telephone Encounter (Signed)
 Pts wife called to follow up on this since they have not been contacted by our office, I informed wife that doctor was in surgery today that as soon as we got an answer someone from our office will be in contact with them.

## 2024-01-23 ENCOUNTER — Telehealth: Payer: Self-pay | Admitting: Podiatry

## 2024-01-23 NOTE — Telephone Encounter (Signed)
 Pts wife called and pt was seen by Dr Jamse Arn and his history was discussed and a medication was sent in and the first thing on the rx warning was could cause heart attacks and pt has hx of cardiac issues.. She said they called back and left message and then called back again and Dr Jamse Arn was at the hospital.She stated he never called them back. They did not get any calls back. She cxled his follow up appt.

## 2024-01-28 DIAGNOSIS — Z6834 Body mass index (BMI) 34.0-34.9, adult: Secondary | ICD-10-CM | POA: Diagnosis not present

## 2024-01-28 DIAGNOSIS — I10 Essential (primary) hypertension: Secondary | ICD-10-CM | POA: Diagnosis not present

## 2024-01-28 DIAGNOSIS — M159 Polyosteoarthritis, unspecified: Secondary | ICD-10-CM | POA: Diagnosis not present

## 2024-01-28 DIAGNOSIS — D692 Other nonthrombocytopenic purpura: Secondary | ICD-10-CM | POA: Diagnosis not present

## 2024-01-28 DIAGNOSIS — R42 Dizziness and giddiness: Secondary | ICD-10-CM | POA: Diagnosis not present

## 2024-01-28 DIAGNOSIS — G610 Guillain-Barre syndrome: Secondary | ICD-10-CM | POA: Diagnosis not present

## 2024-01-28 DIAGNOSIS — I2699 Other pulmonary embolism without acute cor pulmonale: Secondary | ICD-10-CM | POA: Diagnosis not present

## 2024-01-28 DIAGNOSIS — K219 Gastro-esophageal reflux disease without esophagitis: Secondary | ICD-10-CM | POA: Diagnosis not present

## 2024-02-04 DIAGNOSIS — G610 Guillain-Barre syndrome: Secondary | ICD-10-CM | POA: Diagnosis not present

## 2024-02-04 DIAGNOSIS — R278 Other lack of coordination: Secondary | ICD-10-CM | POA: Diagnosis not present

## 2024-02-04 DIAGNOSIS — M6281 Muscle weakness (generalized): Secondary | ICD-10-CM | POA: Diagnosis not present

## 2024-02-11 ENCOUNTER — Ambulatory Visit: Payer: PPO | Admitting: Podiatry

## 2024-02-25 DIAGNOSIS — E785 Hyperlipidemia, unspecified: Secondary | ICD-10-CM | POA: Diagnosis not present

## 2024-02-25 DIAGNOSIS — Z6834 Body mass index (BMI) 34.0-34.9, adult: Secondary | ICD-10-CM | POA: Diagnosis not present

## 2024-02-25 DIAGNOSIS — I10 Essential (primary) hypertension: Secondary | ICD-10-CM | POA: Diagnosis not present

## 2024-02-25 DIAGNOSIS — R2681 Unsteadiness on feet: Secondary | ICD-10-CM | POA: Diagnosis not present

## 2024-02-25 DIAGNOSIS — I2510 Atherosclerotic heart disease of native coronary artery without angina pectoris: Secondary | ICD-10-CM | POA: Diagnosis not present

## 2024-02-25 DIAGNOSIS — R278 Other lack of coordination: Secondary | ICD-10-CM | POA: Diagnosis not present

## 2024-02-25 DIAGNOSIS — I2089 Other forms of angina pectoris: Secondary | ICD-10-CM | POA: Diagnosis not present

## 2024-02-25 DIAGNOSIS — M6281 Muscle weakness (generalized): Secondary | ICD-10-CM | POA: Diagnosis not present

## 2024-02-25 DIAGNOSIS — G610 Guillain-Barre syndrome: Secondary | ICD-10-CM | POA: Diagnosis not present

## 2024-02-25 DIAGNOSIS — R600 Localized edema: Secondary | ICD-10-CM | POA: Diagnosis not present

## 2024-02-25 DIAGNOSIS — D692 Other nonthrombocytopenic purpura: Secondary | ICD-10-CM | POA: Diagnosis not present

## 2024-02-25 DIAGNOSIS — K219 Gastro-esophageal reflux disease without esophagitis: Secondary | ICD-10-CM | POA: Diagnosis not present

## 2024-02-25 DIAGNOSIS — R42 Dizziness and giddiness: Secondary | ICD-10-CM | POA: Diagnosis not present

## 2024-02-25 DIAGNOSIS — I2699 Other pulmonary embolism without acute cor pulmonale: Secondary | ICD-10-CM | POA: Diagnosis not present

## 2024-02-25 DIAGNOSIS — M159 Polyosteoarthritis, unspecified: Secondary | ICD-10-CM | POA: Diagnosis not present

## 2024-02-25 DIAGNOSIS — M545 Low back pain, unspecified: Secondary | ICD-10-CM | POA: Diagnosis not present

## 2024-02-25 DIAGNOSIS — M2560 Stiffness of unspecified joint, not elsewhere classified: Secondary | ICD-10-CM | POA: Diagnosis not present

## 2024-03-10 DIAGNOSIS — R42 Dizziness and giddiness: Secondary | ICD-10-CM | POA: Diagnosis not present

## 2024-03-10 DIAGNOSIS — Z6833 Body mass index (BMI) 33.0-33.9, adult: Secondary | ICD-10-CM | POA: Diagnosis not present

## 2024-03-10 DIAGNOSIS — R600 Localized edema: Secondary | ICD-10-CM | POA: Diagnosis not present

## 2024-03-10 DIAGNOSIS — G610 Guillain-Barre syndrome: Secondary | ICD-10-CM | POA: Diagnosis not present

## 2024-03-10 DIAGNOSIS — K219 Gastro-esophageal reflux disease without esophagitis: Secondary | ICD-10-CM | POA: Diagnosis not present

## 2024-03-10 DIAGNOSIS — I10 Essential (primary) hypertension: Secondary | ICD-10-CM | POA: Diagnosis not present

## 2024-03-10 DIAGNOSIS — I2699 Other pulmonary embolism without acute cor pulmonale: Secondary | ICD-10-CM | POA: Diagnosis not present

## 2024-03-10 DIAGNOSIS — I2510 Atherosclerotic heart disease of native coronary artery without angina pectoris: Secondary | ICD-10-CM | POA: Diagnosis not present

## 2024-03-10 DIAGNOSIS — E785 Hyperlipidemia, unspecified: Secondary | ICD-10-CM | POA: Diagnosis not present

## 2024-03-10 DIAGNOSIS — D692 Other nonthrombocytopenic purpura: Secondary | ICD-10-CM | POA: Diagnosis not present

## 2024-03-10 DIAGNOSIS — I2089 Other forms of angina pectoris: Secondary | ICD-10-CM | POA: Diagnosis not present

## 2024-03-10 DIAGNOSIS — M159 Polyosteoarthritis, unspecified: Secondary | ICD-10-CM | POA: Diagnosis not present

## 2024-03-26 DIAGNOSIS — R278 Other lack of coordination: Secondary | ICD-10-CM | POA: Diagnosis not present

## 2024-03-26 DIAGNOSIS — M6281 Muscle weakness (generalized): Secondary | ICD-10-CM | POA: Diagnosis not present

## 2024-03-26 DIAGNOSIS — G610 Guillain-Barre syndrome: Secondary | ICD-10-CM | POA: Diagnosis not present

## 2024-04-07 DIAGNOSIS — G610 Guillain-Barre syndrome: Secondary | ICD-10-CM | POA: Diagnosis not present

## 2024-04-07 DIAGNOSIS — M159 Polyosteoarthritis, unspecified: Secondary | ICD-10-CM | POA: Diagnosis not present

## 2024-04-07 DIAGNOSIS — I2510 Atherosclerotic heart disease of native coronary artery without angina pectoris: Secondary | ICD-10-CM | POA: Diagnosis not present

## 2024-04-07 DIAGNOSIS — K219 Gastro-esophageal reflux disease without esophagitis: Secondary | ICD-10-CM | POA: Diagnosis not present

## 2024-04-07 DIAGNOSIS — I2699 Other pulmonary embolism without acute cor pulmonale: Secondary | ICD-10-CM | POA: Diagnosis not present

## 2024-04-07 DIAGNOSIS — D692 Other nonthrombocytopenic purpura: Secondary | ICD-10-CM | POA: Diagnosis not present

## 2024-04-07 DIAGNOSIS — E785 Hyperlipidemia, unspecified: Secondary | ICD-10-CM | POA: Diagnosis not present

## 2024-04-07 DIAGNOSIS — I10 Essential (primary) hypertension: Secondary | ICD-10-CM | POA: Diagnosis not present

## 2024-04-07 DIAGNOSIS — L309 Dermatitis, unspecified: Secondary | ICD-10-CM | POA: Diagnosis not present

## 2024-04-07 DIAGNOSIS — E538 Deficiency of other specified B group vitamins: Secondary | ICD-10-CM | POA: Diagnosis not present

## 2024-04-07 DIAGNOSIS — R600 Localized edema: Secondary | ICD-10-CM | POA: Diagnosis not present

## 2024-04-07 DIAGNOSIS — R42 Dizziness and giddiness: Secondary | ICD-10-CM | POA: Diagnosis not present

## 2024-04-28 DIAGNOSIS — R278 Other lack of coordination: Secondary | ICD-10-CM | POA: Diagnosis not present

## 2024-04-28 DIAGNOSIS — G610 Guillain-Barre syndrome: Secondary | ICD-10-CM | POA: Diagnosis not present

## 2024-04-28 DIAGNOSIS — M6281 Muscle weakness (generalized): Secondary | ICD-10-CM | POA: Diagnosis not present

## 2024-05-05 DIAGNOSIS — R600 Localized edema: Secondary | ICD-10-CM | POA: Diagnosis not present

## 2024-05-05 DIAGNOSIS — D692 Other nonthrombocytopenic purpura: Secondary | ICD-10-CM | POA: Diagnosis not present

## 2024-05-05 DIAGNOSIS — E785 Hyperlipidemia, unspecified: Secondary | ICD-10-CM | POA: Diagnosis not present

## 2024-05-05 DIAGNOSIS — R42 Dizziness and giddiness: Secondary | ICD-10-CM | POA: Diagnosis not present

## 2024-05-05 DIAGNOSIS — M159 Polyosteoarthritis, unspecified: Secondary | ICD-10-CM | POA: Diagnosis not present

## 2024-05-05 DIAGNOSIS — I2510 Atherosclerotic heart disease of native coronary artery without angina pectoris: Secondary | ICD-10-CM | POA: Diagnosis not present

## 2024-05-05 DIAGNOSIS — I10 Essential (primary) hypertension: Secondary | ICD-10-CM | POA: Diagnosis not present

## 2024-05-05 DIAGNOSIS — E538 Deficiency of other specified B group vitamins: Secondary | ICD-10-CM | POA: Diagnosis not present

## 2024-05-05 DIAGNOSIS — K219 Gastro-esophageal reflux disease without esophagitis: Secondary | ICD-10-CM | POA: Diagnosis not present

## 2024-05-05 DIAGNOSIS — G610 Guillain-Barre syndrome: Secondary | ICD-10-CM | POA: Diagnosis not present

## 2024-05-05 DIAGNOSIS — H6693 Otitis media, unspecified, bilateral: Secondary | ICD-10-CM | POA: Diagnosis not present

## 2024-05-05 DIAGNOSIS — I2699 Other pulmonary embolism without acute cor pulmonale: Secondary | ICD-10-CM | POA: Diagnosis not present

## 2024-05-11 DIAGNOSIS — M25642 Stiffness of left hand, not elsewhere classified: Secondary | ICD-10-CM | POA: Diagnosis not present

## 2024-05-11 DIAGNOSIS — M25641 Stiffness of right hand, not elsewhere classified: Secondary | ICD-10-CM | POA: Diagnosis not present

## 2024-05-11 DIAGNOSIS — G610 Guillain-Barre syndrome: Secondary | ICD-10-CM | POA: Diagnosis not present

## 2024-05-18 DIAGNOSIS — M25641 Stiffness of right hand, not elsewhere classified: Secondary | ICD-10-CM | POA: Diagnosis not present

## 2024-05-18 DIAGNOSIS — R29898 Other symptoms and signs involving the musculoskeletal system: Secondary | ICD-10-CM | POA: Diagnosis not present

## 2024-05-18 DIAGNOSIS — M25642 Stiffness of left hand, not elsewhere classified: Secondary | ICD-10-CM | POA: Diagnosis not present

## 2024-05-21 DIAGNOSIS — M25641 Stiffness of right hand, not elsewhere classified: Secondary | ICD-10-CM | POA: Diagnosis not present

## 2024-05-21 DIAGNOSIS — M25642 Stiffness of left hand, not elsewhere classified: Secondary | ICD-10-CM | POA: Diagnosis not present

## 2024-05-21 DIAGNOSIS — R29898 Other symptoms and signs involving the musculoskeletal system: Secondary | ICD-10-CM | POA: Diagnosis not present

## 2024-05-26 DIAGNOSIS — M25642 Stiffness of left hand, not elsewhere classified: Secondary | ICD-10-CM | POA: Diagnosis not present

## 2024-05-26 DIAGNOSIS — R29898 Other symptoms and signs involving the musculoskeletal system: Secondary | ICD-10-CM | POA: Diagnosis not present

## 2024-05-26 DIAGNOSIS — M25641 Stiffness of right hand, not elsewhere classified: Secondary | ICD-10-CM | POA: Diagnosis not present

## 2024-05-28 DIAGNOSIS — M25641 Stiffness of right hand, not elsewhere classified: Secondary | ICD-10-CM | POA: Diagnosis not present

## 2024-05-28 DIAGNOSIS — R29898 Other symptoms and signs involving the musculoskeletal system: Secondary | ICD-10-CM | POA: Diagnosis not present

## 2024-05-28 DIAGNOSIS — M25642 Stiffness of left hand, not elsewhere classified: Secondary | ICD-10-CM | POA: Diagnosis not present

## 2024-06-02 DIAGNOSIS — M25642 Stiffness of left hand, not elsewhere classified: Secondary | ICD-10-CM | POA: Diagnosis not present

## 2024-06-02 DIAGNOSIS — M25641 Stiffness of right hand, not elsewhere classified: Secondary | ICD-10-CM | POA: Diagnosis not present

## 2024-06-02 DIAGNOSIS — R29898 Other symptoms and signs involving the musculoskeletal system: Secondary | ICD-10-CM | POA: Diagnosis not present

## 2024-06-04 DIAGNOSIS — M25641 Stiffness of right hand, not elsewhere classified: Secondary | ICD-10-CM | POA: Diagnosis not present

## 2024-06-04 DIAGNOSIS — R29898 Other symptoms and signs involving the musculoskeletal system: Secondary | ICD-10-CM | POA: Diagnosis not present

## 2024-06-04 DIAGNOSIS — M25642 Stiffness of left hand, not elsewhere classified: Secondary | ICD-10-CM | POA: Diagnosis not present

## 2024-06-08 DIAGNOSIS — G610 Guillain-Barre syndrome: Secondary | ICD-10-CM | POA: Diagnosis not present

## 2024-06-09 DIAGNOSIS — R42 Dizziness and giddiness: Secondary | ICD-10-CM | POA: Diagnosis not present

## 2024-06-09 DIAGNOSIS — I2699 Other pulmonary embolism without acute cor pulmonale: Secondary | ICD-10-CM | POA: Diagnosis not present

## 2024-06-09 DIAGNOSIS — M25641 Stiffness of right hand, not elsewhere classified: Secondary | ICD-10-CM | POA: Diagnosis not present

## 2024-06-09 DIAGNOSIS — E538 Deficiency of other specified B group vitamins: Secondary | ICD-10-CM | POA: Diagnosis not present

## 2024-06-09 DIAGNOSIS — I2089 Other forms of angina pectoris: Secondary | ICD-10-CM | POA: Diagnosis not present

## 2024-06-09 DIAGNOSIS — G610 Guillain-Barre syndrome: Secondary | ICD-10-CM | POA: Diagnosis not present

## 2024-06-09 DIAGNOSIS — I2510 Atherosclerotic heart disease of native coronary artery without angina pectoris: Secondary | ICD-10-CM | POA: Diagnosis not present

## 2024-06-09 DIAGNOSIS — R600 Localized edema: Secondary | ICD-10-CM | POA: Diagnosis not present

## 2024-06-09 DIAGNOSIS — M159 Polyosteoarthritis, unspecified: Secondary | ICD-10-CM | POA: Diagnosis not present

## 2024-06-09 DIAGNOSIS — M25642 Stiffness of left hand, not elsewhere classified: Secondary | ICD-10-CM | POA: Diagnosis not present

## 2024-06-09 DIAGNOSIS — R29898 Other symptoms and signs involving the musculoskeletal system: Secondary | ICD-10-CM | POA: Diagnosis not present

## 2024-06-09 DIAGNOSIS — K219 Gastro-esophageal reflux disease without esophagitis: Secondary | ICD-10-CM | POA: Diagnosis not present

## 2024-06-09 DIAGNOSIS — I10 Essential (primary) hypertension: Secondary | ICD-10-CM | POA: Diagnosis not present

## 2024-06-09 DIAGNOSIS — D692 Other nonthrombocytopenic purpura: Secondary | ICD-10-CM | POA: Diagnosis not present

## 2024-06-09 DIAGNOSIS — E785 Hyperlipidemia, unspecified: Secondary | ICD-10-CM | POA: Diagnosis not present

## 2024-06-16 DIAGNOSIS — R29898 Other symptoms and signs involving the musculoskeletal system: Secondary | ICD-10-CM | POA: Diagnosis not present

## 2024-06-16 DIAGNOSIS — M25641 Stiffness of right hand, not elsewhere classified: Secondary | ICD-10-CM | POA: Diagnosis not present

## 2024-06-16 DIAGNOSIS — M25642 Stiffness of left hand, not elsewhere classified: Secondary | ICD-10-CM | POA: Diagnosis not present

## 2024-06-18 DIAGNOSIS — R29898 Other symptoms and signs involving the musculoskeletal system: Secondary | ICD-10-CM | POA: Diagnosis not present

## 2024-06-18 DIAGNOSIS — M25642 Stiffness of left hand, not elsewhere classified: Secondary | ICD-10-CM | POA: Diagnosis not present

## 2024-06-18 DIAGNOSIS — M25641 Stiffness of right hand, not elsewhere classified: Secondary | ICD-10-CM | POA: Diagnosis not present

## 2024-06-23 DIAGNOSIS — M25641 Stiffness of right hand, not elsewhere classified: Secondary | ICD-10-CM | POA: Diagnosis not present

## 2024-06-23 DIAGNOSIS — R29898 Other symptoms and signs involving the musculoskeletal system: Secondary | ICD-10-CM | POA: Diagnosis not present

## 2024-06-23 DIAGNOSIS — M25642 Stiffness of left hand, not elsewhere classified: Secondary | ICD-10-CM | POA: Diagnosis not present

## 2024-07-02 DIAGNOSIS — R29898 Other symptoms and signs involving the musculoskeletal system: Secondary | ICD-10-CM | POA: Diagnosis not present

## 2024-07-02 DIAGNOSIS — M25642 Stiffness of left hand, not elsewhere classified: Secondary | ICD-10-CM | POA: Diagnosis not present

## 2024-07-02 DIAGNOSIS — M25641 Stiffness of right hand, not elsewhere classified: Secondary | ICD-10-CM | POA: Diagnosis not present

## 2024-07-07 DIAGNOSIS — M25642 Stiffness of left hand, not elsewhere classified: Secondary | ICD-10-CM | POA: Diagnosis not present

## 2024-07-07 DIAGNOSIS — M25641 Stiffness of right hand, not elsewhere classified: Secondary | ICD-10-CM | POA: Diagnosis not present

## 2024-07-07 DIAGNOSIS — D692 Other nonthrombocytopenic purpura: Secondary | ICD-10-CM | POA: Diagnosis not present

## 2024-07-07 DIAGNOSIS — G610 Guillain-Barre syndrome: Secondary | ICD-10-CM | POA: Diagnosis not present

## 2024-07-07 DIAGNOSIS — I2510 Atherosclerotic heart disease of native coronary artery without angina pectoris: Secondary | ICD-10-CM | POA: Diagnosis not present

## 2024-07-07 DIAGNOSIS — I10 Essential (primary) hypertension: Secondary | ICD-10-CM | POA: Diagnosis not present

## 2024-07-07 DIAGNOSIS — R600 Localized edema: Secondary | ICD-10-CM | POA: Diagnosis not present

## 2024-07-07 DIAGNOSIS — R42 Dizziness and giddiness: Secondary | ICD-10-CM | POA: Diagnosis not present

## 2024-07-07 DIAGNOSIS — E538 Deficiency of other specified B group vitamins: Secondary | ICD-10-CM | POA: Diagnosis not present

## 2024-07-07 DIAGNOSIS — R29898 Other symptoms and signs involving the musculoskeletal system: Secondary | ICD-10-CM | POA: Diagnosis not present

## 2024-07-07 DIAGNOSIS — K219 Gastro-esophageal reflux disease without esophagitis: Secondary | ICD-10-CM | POA: Diagnosis not present

## 2024-07-07 DIAGNOSIS — M159 Polyosteoarthritis, unspecified: Secondary | ICD-10-CM | POA: Diagnosis not present

## 2024-07-07 DIAGNOSIS — E785 Hyperlipidemia, unspecified: Secondary | ICD-10-CM | POA: Diagnosis not present

## 2024-07-07 DIAGNOSIS — I2699 Other pulmonary embolism without acute cor pulmonale: Secondary | ICD-10-CM | POA: Diagnosis not present

## 2024-07-07 DIAGNOSIS — I2089 Other forms of angina pectoris: Secondary | ICD-10-CM | POA: Diagnosis not present

## 2024-07-16 DIAGNOSIS — R29898 Other symptoms and signs involving the musculoskeletal system: Secondary | ICD-10-CM | POA: Diagnosis not present

## 2024-07-16 DIAGNOSIS — M25641 Stiffness of right hand, not elsewhere classified: Secondary | ICD-10-CM | POA: Diagnosis not present

## 2024-07-16 DIAGNOSIS — M25642 Stiffness of left hand, not elsewhere classified: Secondary | ICD-10-CM | POA: Diagnosis not present

## 2024-07-20 DIAGNOSIS — G610 Guillain-Barre syndrome: Secondary | ICD-10-CM | POA: Diagnosis not present

## 2024-07-21 DIAGNOSIS — M25641 Stiffness of right hand, not elsewhere classified: Secondary | ICD-10-CM | POA: Diagnosis not present

## 2024-07-21 DIAGNOSIS — M25642 Stiffness of left hand, not elsewhere classified: Secondary | ICD-10-CM | POA: Diagnosis not present

## 2024-07-21 DIAGNOSIS — R29898 Other symptoms and signs involving the musculoskeletal system: Secondary | ICD-10-CM | POA: Diagnosis not present

## 2024-07-28 DIAGNOSIS — M25641 Stiffness of right hand, not elsewhere classified: Secondary | ICD-10-CM | POA: Diagnosis not present

## 2024-07-28 DIAGNOSIS — M25642 Stiffness of left hand, not elsewhere classified: Secondary | ICD-10-CM | POA: Diagnosis not present

## 2024-07-28 DIAGNOSIS — R29898 Other symptoms and signs involving the musculoskeletal system: Secondary | ICD-10-CM | POA: Diagnosis not present

## 2024-07-30 DIAGNOSIS — M25641 Stiffness of right hand, not elsewhere classified: Secondary | ICD-10-CM | POA: Diagnosis not present

## 2024-07-30 DIAGNOSIS — R29898 Other symptoms and signs involving the musculoskeletal system: Secondary | ICD-10-CM | POA: Diagnosis not present

## 2024-07-30 DIAGNOSIS — M25642 Stiffness of left hand, not elsewhere classified: Secondary | ICD-10-CM | POA: Diagnosis not present

## 2024-08-04 DIAGNOSIS — M25642 Stiffness of left hand, not elsewhere classified: Secondary | ICD-10-CM | POA: Diagnosis not present

## 2024-08-04 DIAGNOSIS — R29898 Other symptoms and signs involving the musculoskeletal system: Secondary | ICD-10-CM | POA: Diagnosis not present

## 2024-08-04 DIAGNOSIS — M25641 Stiffness of right hand, not elsewhere classified: Secondary | ICD-10-CM | POA: Diagnosis not present

## 2024-08-06 DIAGNOSIS — R29898 Other symptoms and signs involving the musculoskeletal system: Secondary | ICD-10-CM | POA: Diagnosis not present

## 2024-08-06 DIAGNOSIS — M25641 Stiffness of right hand, not elsewhere classified: Secondary | ICD-10-CM | POA: Diagnosis not present

## 2024-08-06 DIAGNOSIS — M25642 Stiffness of left hand, not elsewhere classified: Secondary | ICD-10-CM | POA: Diagnosis not present

## 2024-08-11 DIAGNOSIS — M25642 Stiffness of left hand, not elsewhere classified: Secondary | ICD-10-CM | POA: Diagnosis not present

## 2024-08-11 DIAGNOSIS — M25641 Stiffness of right hand, not elsewhere classified: Secondary | ICD-10-CM | POA: Diagnosis not present

## 2024-08-11 DIAGNOSIS — R29898 Other symptoms and signs involving the musculoskeletal system: Secondary | ICD-10-CM | POA: Diagnosis not present

## 2024-08-13 DIAGNOSIS — R29898 Other symptoms and signs involving the musculoskeletal system: Secondary | ICD-10-CM | POA: Diagnosis not present

## 2024-08-13 DIAGNOSIS — M25642 Stiffness of left hand, not elsewhere classified: Secondary | ICD-10-CM | POA: Diagnosis not present

## 2024-08-13 DIAGNOSIS — M25641 Stiffness of right hand, not elsewhere classified: Secondary | ICD-10-CM | POA: Diagnosis not present

## 2024-08-18 DIAGNOSIS — M25641 Stiffness of right hand, not elsewhere classified: Secondary | ICD-10-CM | POA: Diagnosis not present

## 2024-08-18 DIAGNOSIS — M25642 Stiffness of left hand, not elsewhere classified: Secondary | ICD-10-CM | POA: Diagnosis not present

## 2024-08-18 DIAGNOSIS — R29898 Other symptoms and signs involving the musculoskeletal system: Secondary | ICD-10-CM | POA: Diagnosis not present

## 2024-08-20 DIAGNOSIS — M25642 Stiffness of left hand, not elsewhere classified: Secondary | ICD-10-CM | POA: Diagnosis not present

## 2024-08-20 DIAGNOSIS — M25641 Stiffness of right hand, not elsewhere classified: Secondary | ICD-10-CM | POA: Diagnosis not present

## 2024-08-20 DIAGNOSIS — R29898 Other symptoms and signs involving the musculoskeletal system: Secondary | ICD-10-CM | POA: Diagnosis not present

## 2024-08-25 DIAGNOSIS — R29898 Other symptoms and signs involving the musculoskeletal system: Secondary | ICD-10-CM | POA: Diagnosis not present

## 2024-08-25 DIAGNOSIS — M25642 Stiffness of left hand, not elsewhere classified: Secondary | ICD-10-CM | POA: Diagnosis not present

## 2024-08-25 DIAGNOSIS — M25641 Stiffness of right hand, not elsewhere classified: Secondary | ICD-10-CM | POA: Diagnosis not present

## 2024-08-27 DIAGNOSIS — M25642 Stiffness of left hand, not elsewhere classified: Secondary | ICD-10-CM | POA: Diagnosis not present

## 2024-08-27 DIAGNOSIS — R29898 Other symptoms and signs involving the musculoskeletal system: Secondary | ICD-10-CM | POA: Diagnosis not present

## 2024-08-27 DIAGNOSIS — M25641 Stiffness of right hand, not elsewhere classified: Secondary | ICD-10-CM | POA: Diagnosis not present

## 2024-08-31 DIAGNOSIS — G610 Guillain-Barre syndrome: Secondary | ICD-10-CM | POA: Diagnosis not present

## 2024-08-31 DIAGNOSIS — M25641 Stiffness of right hand, not elsewhere classified: Secondary | ICD-10-CM | POA: Diagnosis not present

## 2024-08-31 DIAGNOSIS — M25642 Stiffness of left hand, not elsewhere classified: Secondary | ICD-10-CM | POA: Diagnosis not present

## 2024-09-03 DIAGNOSIS — M25642 Stiffness of left hand, not elsewhere classified: Secondary | ICD-10-CM | POA: Diagnosis not present

## 2024-09-03 DIAGNOSIS — R29898 Other symptoms and signs involving the musculoskeletal system: Secondary | ICD-10-CM | POA: Diagnosis not present

## 2024-09-03 DIAGNOSIS — M25641 Stiffness of right hand, not elsewhere classified: Secondary | ICD-10-CM | POA: Diagnosis not present

## 2024-09-15 DIAGNOSIS — M25642 Stiffness of left hand, not elsewhere classified: Secondary | ICD-10-CM | POA: Diagnosis not present

## 2024-09-15 DIAGNOSIS — M25641 Stiffness of right hand, not elsewhere classified: Secondary | ICD-10-CM | POA: Diagnosis not present

## 2024-09-15 DIAGNOSIS — R29898 Other symptoms and signs involving the musculoskeletal system: Secondary | ICD-10-CM | POA: Diagnosis not present

## 2024-09-21 DIAGNOSIS — H25812 Combined forms of age-related cataract, left eye: Secondary | ICD-10-CM | POA: Diagnosis not present

## 2024-10-13 DIAGNOSIS — H25812 Combined forms of age-related cataract, left eye: Secondary | ICD-10-CM | POA: Diagnosis not present

## 2024-10-13 DIAGNOSIS — Z01818 Encounter for other preprocedural examination: Secondary | ICD-10-CM | POA: Diagnosis not present

## 2024-10-13 DIAGNOSIS — H2513 Age-related nuclear cataract, bilateral: Secondary | ICD-10-CM | POA: Diagnosis not present

## 2024-10-13 DIAGNOSIS — H353131 Nonexudative age-related macular degeneration, bilateral, early dry stage: Secondary | ICD-10-CM | POA: Diagnosis not present

## 2024-10-26 DIAGNOSIS — G610 Guillain-Barre syndrome: Secondary | ICD-10-CM | POA: Diagnosis not present
